# Patient Record
Sex: Male | Born: 1945 | Race: White | Hispanic: No | Marital: Married | Smoking: Former smoker
Health system: Southern US, Community
[De-identification: ages and names within clinical notes are randomized; demographics above are authoritative.]

## PROBLEM LIST (undated history)

## (undated) DIAGNOSIS — Z973 Presence of spectacles and contact lenses: Secondary | ICD-10-CM

## (undated) DIAGNOSIS — M1990 Unspecified osteoarthritis, unspecified site: Secondary | ICD-10-CM

## (undated) DIAGNOSIS — R351 Nocturia: Secondary | ICD-10-CM

## (undated) DIAGNOSIS — C61 Malignant neoplasm of prostate: Secondary | ICD-10-CM

## (undated) DIAGNOSIS — Z8619 Personal history of other infectious and parasitic diseases: Secondary | ICD-10-CM

## (undated) DIAGNOSIS — E7800 Pure hypercholesterolemia, unspecified: Secondary | ICD-10-CM

## (undated) DIAGNOSIS — I10 Essential (primary) hypertension: Secondary | ICD-10-CM

## (undated) HISTORY — PX: NO PAST SURGERIES: SHX2092

---

## 2015-11-28 ENCOUNTER — Ambulatory Visit
Admission: RE | Admit: 2015-11-28 | Discharge: 2015-11-28 | Disposition: A | Discharge status: Home / Self Care | Payer: 59 | Source: Ambulatory Visit | Attending: Radiation Oncology | Admitting: Radiation Oncology

## 2015-11-28 ENCOUNTER — Encounter: Payer: Self-pay | Admitting: Radiation Oncology

## 2015-11-28 VITALS — BP 139/76 | HR 98 | Resp 16 | Ht 73.0 in | Wt 194.6 lb

## 2015-11-28 DIAGNOSIS — I10 Essential (primary) hypertension: Secondary | ICD-10-CM | POA: Diagnosis not present

## 2015-11-28 DIAGNOSIS — E7800 Pure hypercholesterolemia, unspecified: Secondary | ICD-10-CM | POA: Insufficient documentation

## 2015-11-28 DIAGNOSIS — C61 Malignant neoplasm of prostate: Secondary | ICD-10-CM | POA: Insufficient documentation

## 2015-11-28 DIAGNOSIS — Z8619 Personal history of other infectious and parasitic diseases: Secondary | ICD-10-CM | POA: Diagnosis not present

## 2015-11-28 DIAGNOSIS — Z510 Encounter for antineoplastic radiation therapy: Secondary | ICD-10-CM | POA: Diagnosis present

## 2015-11-28 DIAGNOSIS — Z87891 Personal history of nicotine dependence: Secondary | ICD-10-CM | POA: Diagnosis not present

## 2015-11-28 HISTORY — DX: Pure hypercholesterolemia, unspecified: E78.00

## 2015-11-28 HISTORY — DX: Essential (primary) hypertension: I10

## 2015-11-28 HISTORY — DX: Personal history of other infectious and parasitic diseases: Z86.19

## 2015-11-28 HISTORY — DX: Malignant neoplasm of prostate: C61

## 2015-11-28 NOTE — Progress Notes (Signed)
See progress note under physician encounter. 

## 2015-11-28 NOTE — Progress Notes (Signed)
Radiation Oncology         [phone] ________________________________  Initial outpatient Consultation  Name: Miguel Larson MRN: [id number]  Date: 11/28/2015  DOB: [birth year]  CC:No primary care provider on file.  Carolan Clines, MD   REFERRING PHYSICIAN: Carolan Clines, MD  DIAGNOSIS: 70 y.o. gentleman with stage T1c adenocarcinoma of the prostate with a Gleason's score of 3+4 and a PSA of 4.22    ICD-9-CM ICD-10-CM   1. Malignant neoplasm of prostate ([place]) Miguel Larson is a 70 y.o. gentleman.  He was noted to have an elevated PSA of 4.3 by his primary care physician, Dr. Shelia Media.  Accordingly, he was referred for evaluation in urology by Dr. Gaynelle Arabian on 10/02/15.  The patient proceeded to transrectal ultrasound with 12 biopsies of the prostate on 11/13/15, digital rectal examination was performed at that time revealing no palpable mass.  The prostate volume measured 23.82 cc. Out of 12 core biopsies, 6 were positive.  The maximum Gleason score was 3+4, and this was seen in the left mid.  The patient reviewed the biopsy results with his urologist and he has kindly been referred today for discussion of potential radiation treatment options.    PREVIOUS RADIATION THERAPY: No  PAST MEDICAL HISTORY:  Past Medical History  Diagnosis Date  . Prostate cancer ([place])   . Hypercholesterolemia   . Hypertension   . History of hepatitis A virus infection      PAST SURGICAL HISTORY: Past Surgical History  Procedure Laterality Date  . Prostate biopsy      FAMILY HISTORY:  Family History  Problem Relation Age of Onset  . Cancer Father     colon  . Cancer Brother     skin  . Cancer Brother     blood disorder    SOCIAL HISTORY:  reports that he has quit smoking. His smoking use included Cigarettes. He has a 2 pack-year smoking history. He has never used smokeless tobacco. He reports that he drinks alcohol. He reports that he  does not use illicit drugs.  ALLERGIES: Review of patient's allergies indicates no known allergies.  MEDICATIONS:  Current Outpatient Prescriptions  Medication Sig Dispense Refill  . ALPRAZolam (XANAX) 0.25 MG tablet Take 0.25 mg by mouth at bedtime as needed for anxiety.    . benazepril (LOTENSIN) 20 MG tablet     . Multiple Vitamins-Minerals (MULTIVITAMIN GUMMIES ADULTS) CHEW Chew by mouth.    . sildenafil (VIAGRA) 100 MG tablet Take 100 mg by mouth daily as needed for erectile dysfunction.     No current facility-administered medications for this encounter.    REVIEW OF SYSTEMS: On review of systems, the patient reports that he is doing quite well overall. He states that he is pretty active, and does not any difficulty with shortness of breath or chest pain. He is not experiencing any fevers, chills, unintended weight changes, or night sweats. He denies any bowel disturbances. He does score in the mild category for erectile dysfunction and uses Viagra as needed. The patient completed an IPSS and IIEF questionnaire.  His IPSS score was 8 indicating moderate urinary outflow obstructive symptoms. He experiences nocturia, difficulty starting urinary stream and intermittent changes of being able to initiate stream, and a weak stream overall at times. Complete review of systems is obtained and is otherwise negative    PHYSICAL EXAM:   height is 6\' 1"  (1.854 m) and weight is 194 lb 9.6 oz (88.27  kg). His blood pressure is 139/76 and his pulse is 98. His respiration is 16 and oxygen saturation is 100%.   Pain scale 0/10 In general this is a well appearing Caucasian male in no acute distress. He is alert and oriented x4 and appropriate throughout the examination. HEENT reveals that the patient is normocephalic, atraumatic. EOMs are intact. PERRLA. Skin is intact without any evidence of gross lesions. Cardiovascular exam reveals a regular rate and rhythm, no clicks rubs or murmurs are auscultated. Chest  is clear to auscultation bilaterally. Lymphatic assessment is performed and does not reveal any adenopathy in the cervical, supraclavicular, axillary, or inguinal chains. Abdomen has active bowel sounds in all quadrants and is intact. The abdomen is soft, non tender, non distended. Lower extremities are negative for pretibial pitting edema, deep calf tenderness, cyanosis or clubbing.  KPS = 100  100 - Normal; no complaints; no evidence of disease. 90   - Able to carry on normal activity; minor signs or symptoms of disease. 80   - Normal activity with effort; some signs or symptoms of disease. 35   - Cares for self; unable to carry on normal activity or to do active work. 60   - Requires occasional assistance, but is able to care for most of his personal needs. 50   - Requires considerable assistance and frequent medical care. 50   - Disabled; requires special care and assistance. 21   - Severely disabled; hospital admission is indicated although death not imminent. 85   - Very sick; hospital admission necessary; active supportive treatment necessary. 10   - Moribund; fatal processes progressing rapidly. 0     - Dead  Karnofsky DA, Abelmann WH, Craver LS and Burchenal JH [phone]) The use of the nitrogen mustards in the palliative treatment of carcinoma: with particular reference to bronchogenic carcinoma Cancer 1 634-56   LABORATORY DATA:  No results found for: WBC, HGB, HCT, MCV, PLT No results found for: NA, K, CL, CO2 No results found for: ALT, AST, GGT, ALKPHOS, BILITOT   RADIOGRAPHY: No results found.    IMPRESSION: This gentleman is a pleasant 69 year-old with stage T1c adenocarcinoma of the prostate with a Gleason's score of 3+4 and a PSA of 4.22.  His T-Stage, Gleason's Score, and PSA put him into the intermediate risk group.  Accordingly he is eligible for a variety of potential treatment options including prostate seed implant, prostatectomy, and external beam radiation.  PLAN: Dr.  Tammi Klippel discusses the patient's biopsy findings, PSA, and symptoms. He reviews the patient's workup to date.  We discussed the natural history of prostate cancer.  We reviewed the the implications of T-stage, Gleason's Score, and PSA on decision-making and outcomes in prostate cancer.  We discussed radiation treatment in the management of prostate cancer with regard to the logistics and delivery of external beam radiation treatment as well as the logistics and delivery of prostate brachytherapy.  We compared and contrasted each of these approaches and also compared these against prostatectomy.  The patient expressed interest in prostate brachytherapy. Dr. Tammi Klippel filled out a patient counseling form for him with relevant treatment diagrams and we retained a copy for our records.   The patient is still deciding how he would like to proceed with treatment. The patient is scheduled to meet with Dr. Alinda Money on 12/10/15 to discuss surgical options.   We enjoyed meeting with him today, and will look forward to participating in the care of this very nice gentleman. I will  recheck to the patient following his visit with Dr. Alinda Money to see if he has any additional questions regarding the role of radiotherapy.   The above documentation reflects my direct findings during this shared patient visit. Please see the separate note by Dr. Tammi Klippel on this date for the remainder of the patient's plan of care.  Carola Rhine, PAC     This document serves as a record of services personally performed by Shona Simpson, PAC and Tyler Pita, MD. It was created on their behalf by Arlyce Harman, a trained medical scribe. The creation of this record is based on the scribe's personal observations and the provider's statements to them. This document has been checked and approved by the attending provider.

## 2015-11-28 NOTE — Progress Notes (Signed)
GU Location of Tumor / Histology: prostatic adenocarcinoma  If Prostate Cancer, Gleason Score is (3 + 4) and PSA is (4.22)  Miguel Larson was referred to Dr. Gaynelle Arabian from Dr. Shelia Media for further evaluation of an elevated PSA of 4.3 on 10/02/15.  Biopsies of prostate (if applicable) revealed:    Past/Anticipated interventions by urology, if any: biopsy and referral to Dr. Tammi Klippel  Past/Anticipated interventions by medical oncology, if any: no  Weight changes, if any: no  Bowel/Bladder complaints, if any: nocturia x 1, difficulty starting the urinary stream less than half the time, weak urinary stream intermittently, intermittent urinary stream,  and ED. Denies incontinence or leakage. Denies dysuria or hematuria.    Nausea/Vomiting, if any: no  Pain issues, if any:  no  SAFETY ISSUES:  Prior radiation? no  Pacemaker/ICD? no  Possible current pregnancy? no  Is the patient on methotrexate? no  Current Complaints / other details:  70 year old male. NKDA. Prostate volume 23.82 cc.

## 2016-01-07 ENCOUNTER — Other Ambulatory Visit: Payer: Self-pay | Admitting: Urology

## 2016-01-09 ENCOUNTER — Telehealth: Payer: Self-pay | Admitting: *Deleted

## 2016-01-09 NOTE — Telephone Encounter (Signed)
Called patient to inform of pre-seed appt. On 01-16-16, and his implant on 03-12-16, spoke with patient and he is aware of these appts.

## 2016-01-15 ENCOUNTER — Telehealth: Payer: Self-pay | Admitting: *Deleted

## 2016-01-15 NOTE — Telephone Encounter (Signed)
CALLED PATIENT TO REMIND OF APPTS. FOR 01-16-16, LVM FOR A RETURN CALL

## 2016-01-16 ENCOUNTER — Ambulatory Visit
Admission: RE | Admit: 2016-01-16 | Discharge: 2016-01-16 | Disposition: A | Discharge status: Home / Self Care | Payer: 59 | Source: Ambulatory Visit | Attending: Radiation Oncology | Admitting: Radiation Oncology

## 2016-01-16 DIAGNOSIS — C61 Malignant neoplasm of prostate: Secondary | ICD-10-CM

## 2016-01-16 DIAGNOSIS — Z510 Encounter for antineoplastic radiation therapy: Secondary | ICD-10-CM | POA: Diagnosis not present

## 2016-01-16 NOTE — Progress Notes (Signed)
Radiation Oncology         [phone] ________________________________  Name: Kacie Kristiansen MRN: [id number]  Date: 01/16/2016  DOB: Nov 14, 1945  Follow-Up Visit Note  CC: No primary care provider on file.  Carolan Clines, MD  Diagnosis:  70 y.o. mael with Stage T1c adenocarcinoma of the prostate with Gleason Score 3+4 and PSA of 4.22.     ICD-9-CM ICD-10-CM   1. Malignant neoplasm of prostate ([place]) [place]     Narrative:  The patient returns today for routine follow-up in anticipation of undergoing radioactive seed implant to the prostate. This is tentatively scheduled for 03/11/16 with Dr. Gaynelle Arabian. When he was seen on 11/28/15 he reported his IPSS score as an 8. His pubic arch study was performed today in the clinic and      adequate visualization of the entire prostate was noted, his prosthetic volume was 37 cc.                       On review of systems, the patient reports that he is doing well overall. He denies any chest pain, shortness of breath, cough, fevers, chills, night sweats, unintended weight changes. He denies any bowel or bladder disturbances, and denies abdominal pain, nausea or vomiting. He denies any new musculoskeletal or joint aches or pains. A complete review of systems is obtained and is otherwise negative.   Past Medical History:  Past Medical History  Diagnosis Date  . Prostate cancer ([place])   . Hypercholesterolemia   . Hypertension   . History of hepatitis A virus infection     Past Surgical History: Past Surgical History  Procedure Laterality Date  . Prostate biopsy      Social History:  Social History   Social History  . Marital Status: Married    Spouse Name: N/A  . Number of Children: N/A  . Years of Education: N/A   Occupational History  . Not on file.   Social History Main Topics  . Smoking status: Former Smoker -- 1.00 packs/day for 2 years    Types: Cigarettes  . Smokeless tobacco: Never Used  . Alcohol Use: Yes  . Drug  Use: No  . Sexual Activity: Yes   Other Topics Concern  . Not on file   Social History Narrative  . No narrative on file  The patient resides in [place].   Family History: Family History  Problem Relation Age of Onset  . Cancer Father     colon  . Cancer Brother     skin  . Cancer Brother     blood disorder    ALLERGIES:  has No Known Allergies.  Meds: Current Outpatient Prescriptions  Medication Sig Dispense Refill  . ALPRAZolam (XANAX) 0.25 MG tablet Take 0.25 mg by mouth at bedtime as needed for anxiety.    . benazepril (LOTENSIN) 20 MG tablet     . Multiple Vitamins-Minerals (MULTIVITAMIN GUMMIES ADULTS) CHEW Chew by mouth.    . sildenafil (VIAGRA) 100 MG tablet Take 100 mg by mouth daily as needed for erectile dysfunction.     No current facility-administered medications for this encounter.    Physical Findings:  vitals were not taken for this visit.  Pain scale 0/10 In general this is a well appearing Caucasian male in no acute distress. He's alert and oriented x4 and appropriate throughout the examination. Cardiopulmonary assessment is negative for acute distress and he exhibits normal effort.    Lab Findings: No results  found for: WBC, HGB, HCT, PLT  No results found for: NA, K, CHLORIDE, CO2, GLUCOSE, BUN, CREATININE, BILITOT, ALKPHOS, AST, ALT, PROT, ALBUMIN, CALCIUM, ANIONGAP  Radiographic Findings: No results found.  Impression/Plan: 1. T1c adenocarcinoma of the prostate with a Gleason's score of 3+4 and a PSA of 4.22. Dr. Johny Shears evaluated the patient is well, and benefits, perioperative expectations, along the short-term effects of radioactive seed implant. We did discuss precautions regarding radioactivity in exposure to pregnant women and young children. We will plan to see the patient 3 weeks following his radioactive seed implant, and he is encouraged to call if he has any questions or concerns prior to the actual procedure. He is advised to  discuss his interest in hospitalization for the procedure with Dr. Gaynelle Arabian. At the end of the conversation all questions were answered to his satisfaction, and he is ready to move forward.    The above documentation reflects my direct findings during this shared patient visit. Please see the separate simulation documentation and note by Dr. Tammi Klippel on this date for the remainder of the patient's plan of care.    Carola Rhine, PAC

## 2016-01-16 NOTE — Progress Notes (Signed)
  Radiation Oncology         [phone] ________________________________  Name: Kass Weisheit MRN: [id number]  Date: 01/16/2016  DOB: 12/22/1945  SIMULATION AND TREATMENT PLANNING NOTE PUBIC ARCH STUDY  CC:No primary care provider on file.  Carolan Clines, MD  DIAGNOSIS: 70 y.o. gentleman with stage T1c adenocarcinoma of the prostate with a Gleason's score of 3+4 and a PSA of 4.22     ICD-9-CM ICD-10-CM   1. Malignant neoplasm of prostate ([place]) [place]:  The patient presented today for evaluation for possible prostate seed implant. He was brought to the radiation planning suite and placed supine on the CT couch. A 3-dimensional image study set was obtained in upload to the planning computer. There, on each axial slice, I contoured the prostate gland. Then, using three-dimensional radiation planning tools I reconstructed the prostate in view of the structures from the transperineal needle pathway to assess for possible pubic arch interference. In doing so, I did not appreciate any pubic arch interference. Also, the patient's prostate volume was estimated based on the drawn structure. The volume was 37 cc.  Given the pubic arch appearance and prostate volume, patient remains a good candidate to proceed with prostate seed implant. Today, he freely provided informed written consent to proceed.    PLAN: The patient will undergo prostate seed implant.   ________________________________  Sheral Apley. Tammi Klippel, M.D.    This document serves as a record of services personally performed by Tyler Pita, MD. It was created on his behalf by Lendon Collar, a trained medical scribe. The creation of this record is based on the scribe's personal observations and the provider's statements to them. This document has been checked and approved by the attending provider.

## 2016-01-17 ENCOUNTER — Ambulatory Visit (HOSPITAL_COMMUNITY)
Admission: RE | Admit: 2016-01-17 | Discharge: 2016-01-17 | Disposition: A | Discharge status: Home / Self Care | Payer: 59 | Source: Ambulatory Visit | Attending: Urology | Admitting: Urology

## 2016-01-17 DIAGNOSIS — C61 Malignant neoplasm of prostate: Secondary | ICD-10-CM | POA: Insufficient documentation

## 2016-01-20 ENCOUNTER — Telehealth: Payer: Self-pay | Admitting: *Deleted

## 2016-01-20 NOTE — Telephone Encounter (Signed)
CALLED PATIENT TO ASK QUESTION, LVM FOR A RETURN CALL 

## 2016-01-31 ENCOUNTER — Encounter: Payer: Self-pay | Admitting: Radiation Oncology

## 2016-03-04 ENCOUNTER — Telehealth: Payer: Self-pay | Admitting: *Deleted

## 2016-03-04 NOTE — Telephone Encounter (Signed)
CALLED PATIENT TO REMIND OF LABS FOR 03-05-16 FOR PROCEDURE ON 03-12-16, SPOKE WITH PATIENT'S WIFE - GINA AND SHE IS AWARE OF THIS APPT.

## 2016-03-04 NOTE — Telephone Encounter (Signed)
XXXX 

## 2016-03-05 ENCOUNTER — Encounter (HOSPITAL_BASED_OUTPATIENT_CLINIC_OR_DEPARTMENT_OTHER): Payer: Self-pay | Admitting: *Deleted

## 2016-03-05 DIAGNOSIS — Z79899 Other long term (current) drug therapy: Secondary | ICD-10-CM | POA: Diagnosis not present

## 2016-03-05 DIAGNOSIS — I10 Essential (primary) hypertension: Secondary | ICD-10-CM | POA: Diagnosis not present

## 2016-03-05 DIAGNOSIS — N529 Male erectile dysfunction, unspecified: Secondary | ICD-10-CM | POA: Diagnosis not present

## 2016-03-05 DIAGNOSIS — Z87891 Personal history of nicotine dependence: Secondary | ICD-10-CM | POA: Diagnosis not present

## 2016-03-05 DIAGNOSIS — C61 Malignant neoplasm of prostate: Secondary | ICD-10-CM | POA: Diagnosis not present

## 2016-03-05 LAB — COMPREHENSIVE METABOLIC PANEL
ALBUMIN: 4.5 g/dL (ref 3.5–5.0)
ALK PHOS: 55 U/L (ref 38–126)
ALT: 23 U/L (ref 17–63)
ANION GAP: 5 (ref 5–15)
AST: 23 U/L (ref 15–41)
BILIRUBIN TOTAL: 0.7 mg/dL (ref 0.3–1.2)
BUN: 24 mg/dL — AB (ref 6–20)
CALCIUM: 9.4 mg/dL (ref 8.9–10.3)
CO2: 27 mmol/L (ref 22–32)
Chloride: 105 mmol/L (ref 101–111)
Creatinine, Ser: 1.24 mg/dL (ref 0.61–1.24)
GFR calc Af Amer: >60 mL/min (ref >60)
GFR calc non Af Amer: 57 mL/min — ABNORMAL LOW (ref >60)
GLUCOSE: 74 mg/dL (ref 65–99)
Potassium: 4.5 mmol/L (ref 3.5–5.1)
Sodium: 137 mmol/L (ref 135–145)
TOTAL PROTEIN: 7.3 g/dL (ref 6.5–8.1)

## 2016-03-05 LAB — PROTIME-INR
INR: 1.1 (ref 0.00–1.49)
Prothrombin Time: 13.9 seconds (ref 11.6–15.2)

## 2016-03-05 LAB — CBC
HEMATOCRIT: 40.1 % (ref 39.0–52.0)
HEMOGLOBIN: 13.5 g/dL (ref 13.0–17.0)
MCH: 31.8 pg (ref 26.0–34.0)
MCHC: 33.7 g/dL (ref 30.0–36.0)
MCV: 94.4 fL (ref 78.0–100.0)
Platelets: 289 10*3/uL (ref 150–400)
RBC: 4.25 MIL/uL (ref 4.22–5.81)
RDW: 13.5 % (ref 11.5–15.5)
WBC: 5 10*3/uL (ref 4.0–10.5)

## 2016-03-05 LAB — APTT: APTT: 28 s (ref 24–37)

## 2016-03-05 NOTE — Progress Notes (Signed)
NPO AFTER MN.  ARRIVE AT 0700.  CURRENT LAB RESULTS, CXR, AND EKG IN CHART AND EPIC.  WILL DO FLEET ENEMA AM DOS .

## 2016-03-09 ENCOUNTER — Ambulatory Visit: Admission: RE | Admit: 2016-03-09 | Payer: 59 | Source: Ambulatory Visit

## 2016-03-11 ENCOUNTER — Telehealth: Payer: Self-pay | Admitting: *Deleted

## 2016-03-11 NOTE — Anesthesia Preprocedure Evaluation (Signed)
Anesthesia Evaluation  Patient identified by MRN, date of birth, ID band Patient awake    Reviewed: Allergy & Precautions, H&P , NPO status , Patient's Chart, lab work & pertinent test results  History of Anesthesia Complications Negative for: history of anesthetic complications  Airway Mallampati: II  TM Distance: >3 FB Neck ROM: full    Dental no notable dental hx.    Pulmonary former smoker,    Pulmonary exam normal breath sounds clear to auscultation       Cardiovascular hypertension, On Medications Normal cardiovascular exam Rhythm:regular Rate:Normal     Neuro/Psych negative neurological ROS     GI/Hepatic negative GI ROS, Neg liver ROS,   Endo/Other  negative endocrine ROS  Renal/GU negative Renal ROS     Musculoskeletal  (+) Arthritis ,   Abdominal   Peds  Hematology negative hematology ROS (+)   Anesthesia Other Findings   Reproductive/Obstetrics negative OB ROS                             Anesthesia Physical Anesthesia Plan  ASA: II  Anesthesia Plan: General   Post-op Pain Management:    Induction: Intravenous  Airway Management Planned: Oral ETT and LMA  Additional Equipment:   Intra-op Plan:   Post-operative Plan: Extubation in OR  Informed Consent: I have reviewed the patients History and Physical, chart, labs and discussed the procedure including the risks, benefits and alternatives for the proposed anesthesia with the patient or authorized representative who has indicated his/her understanding and acceptance.   Dental Advisory Given  Plan Discussed with: Anesthesiologist, CRNA and Surgeon  Anesthesia Plan Comments: (LMA vs ETT)        Anesthesia Quick Evaluation

## 2016-03-11 NOTE — Telephone Encounter (Signed)
CALLED PATIENT TO REMIND OF IMPLANT FOR 03-12-16, SPOKE WITH PATIENT'S WIFE- GINA AND SHE IS AWARE OF THIS PROCEDURE

## 2016-03-12 ENCOUNTER — Ambulatory Visit (HOSPITAL_COMMUNITY): Payer: Managed Care, Other (non HMO)

## 2016-03-12 ENCOUNTER — Ambulatory Visit (HOSPITAL_BASED_OUTPATIENT_CLINIC_OR_DEPARTMENT_OTHER)
Admission: RE | Admit: 2016-03-12 | Discharge: 2016-03-12 | Disposition: A | Discharge status: Home / Self Care | Payer: Managed Care, Other (non HMO) | Source: Ambulatory Visit | Attending: Urology | Admitting: Urology

## 2016-03-12 ENCOUNTER — Ambulatory Visit (HOSPITAL_BASED_OUTPATIENT_CLINIC_OR_DEPARTMENT_OTHER): Payer: Managed Care, Other (non HMO) | Admitting: Anesthesiology

## 2016-03-12 ENCOUNTER — Encounter (HOSPITAL_BASED_OUTPATIENT_CLINIC_OR_DEPARTMENT_OTHER)
Admission: RE | Disposition: A | Discharge status: Home / Self Care | Payer: Self-pay | Source: Ambulatory Visit | Attending: Urology

## 2016-03-12 ENCOUNTER — Encounter (HOSPITAL_BASED_OUTPATIENT_CLINIC_OR_DEPARTMENT_OTHER): Payer: Self-pay | Admitting: *Deleted

## 2016-03-12 DIAGNOSIS — C61 Malignant neoplasm of prostate: Secondary | ICD-10-CM | POA: Diagnosis not present

## 2016-03-12 DIAGNOSIS — N529 Male erectile dysfunction, unspecified: Secondary | ICD-10-CM | POA: Insufficient documentation

## 2016-03-12 DIAGNOSIS — Z87891 Personal history of nicotine dependence: Secondary | ICD-10-CM | POA: Insufficient documentation

## 2016-03-12 DIAGNOSIS — Z79899 Other long term (current) drug therapy: Secondary | ICD-10-CM | POA: Insufficient documentation

## 2016-03-12 DIAGNOSIS — I10 Essential (primary) hypertension: Secondary | ICD-10-CM | POA: Insufficient documentation

## 2016-03-12 HISTORY — DX: Nocturia: R35.1

## 2016-03-12 HISTORY — PX: RADIOACTIVE SEED IMPLANT: SHX5150

## 2016-03-12 HISTORY — DX: Presence of spectacles and contact lenses: Z97.3

## 2016-03-12 HISTORY — DX: Unspecified osteoarthritis, unspecified site: M19.90

## 2016-03-12 SURGERY — INSERTION, RADIATION SOURCE, PROSTATE
Anesthesia: General | Site: Prostate

## 2016-03-12 MED ORDER — HYOSCYAMINE SULFATE 0.125 MG SL SUBL
SUBLINGUAL_TABLET | SUBLINGUAL | Status: AC
  Filled 2016-03-12: qty 1

## 2016-03-12 MED ORDER — STERILE WATER FOR IRRIGATION IR SOLN
Status: DC | PRN
  Administered 2016-03-12: 3 mL

## 2016-03-12 MED ORDER — KETOROLAC TROMETHAMINE 30 MG/ML IJ SOLN
INTRAMUSCULAR | Status: AC
  Filled 2016-03-12: qty 1

## 2016-03-12 MED ORDER — TAMSULOSIN HCL 0.4 MG PO CAPS: 0.4000 mg | ORAL_CAPSULE | Freq: Every day | ORAL | Status: DC

## 2016-03-12 MED ORDER — ONDANSETRON HCL 4 MG/2ML IJ SOLN
INTRAMUSCULAR | Status: DC | PRN
  Administered 2016-03-12: 4 mg via INTRAVENOUS

## 2016-03-12 MED ORDER — FENTANYL CITRATE (PF) 100 MCG/2ML IJ SOLN
INTRAMUSCULAR | Status: DC | PRN
  Administered 2016-03-12: 25 ug via INTRAVENOUS
  Administered 2016-03-12: 50 ug via INTRAVENOUS
  Administered 2016-03-12: 25 ug via INTRAVENOUS
  Administered 2016-03-12 (×2): 50 ug via INTRAVENOUS

## 2016-03-12 MED ORDER — APAP-CAFF-DIHYDROCODEINE 325-30-16 MG PO TABS: 325.0000 mg | ORAL_TABLET | Freq: Four times a day (QID) | ORAL | Status: DC | PRN

## 2016-03-12 MED ORDER — PHENAZOPYRIDINE HCL 200 MG PO TABS: 200.0000 mg | ORAL_TABLET | Freq: Three times a day (TID) | ORAL | Status: DC | PRN

## 2016-03-12 MED ORDER — SODIUM CHLORIDE 0.9 % IR SOLN
Status: DC | PRN
  Administered 2016-03-12: 1000 mL via INTRAVESICAL

## 2016-03-12 MED ORDER — PROPOFOL 10 MG/ML IV BOLUS
INTRAVENOUS | Status: DC | PRN
  Administered 2016-03-12: 160 mg via INTRAVENOUS

## 2016-03-12 MED ORDER — PROMETHAZINE HCL 25 MG/ML IJ SOLN
6.2500 mg | INTRAMUSCULAR | Status: DC | PRN
  Filled 2016-03-12: qty 1

## 2016-03-12 MED ORDER — TAMSULOSIN HCL 0.4 MG PO CAPS
ORAL_CAPSULE | ORAL | Status: AC
  Filled 2016-03-12: qty 1

## 2016-03-12 MED ORDER — TRIMETHOPRIM 100 MG PO TABS
100.0000 mg | ORAL_TABLET | ORAL | Status: DC
Start: 2016-03-12 — End: 2016-10-21

## 2016-03-12 MED ORDER — CIPROFLOXACIN IN D5W 400 MG/200ML IV SOLN
INTRAVENOUS | Status: AC
  Filled 2016-03-12: qty 200

## 2016-03-12 MED ORDER — FENTANYL CITRATE (PF) 100 MCG/2ML IJ SOLN
INTRAMUSCULAR | Status: AC
  Filled 2016-03-12: qty 2

## 2016-03-12 MED ORDER — ACETAMINOPHEN 500 MG PO TABS
1000.0000 mg | ORAL_TABLET | Freq: Four times a day (QID) | ORAL | Status: DC | PRN
Start: 2016-03-12 — End: 2016-03-12
  Administered 2016-03-12: 1000 mg via ORAL
  Filled 2016-03-12: qty 2

## 2016-03-12 MED ORDER — LACTATED RINGERS IV SOLN
INTRAVENOUS | Status: DC
  Administered 2016-03-12 (×2): via INTRAVENOUS
  Filled 2016-03-12: qty 1000

## 2016-03-12 MED ORDER — ACETAMINOPHEN 500 MG PO TABS
ORAL_TABLET | ORAL | Status: AC
  Filled 2016-03-12: qty 2

## 2016-03-12 MED ORDER — BELLADONNA ALKALOIDS-OPIUM 16.2-60 MG RE SUPP
RECTAL | Status: DC | PRN
  Administered 2016-03-12: 1 via RECTAL

## 2016-03-12 MED ORDER — DIATRIZOATE MEGLUMINE 30 % UR SOLN
URETHRAL | Status: DC | PRN
  Administered 2016-03-12: 7 mL via URETHRAL

## 2016-03-12 MED ORDER — PHENAZOPYRIDINE HCL 100 MG PO TABS
ORAL_TABLET | ORAL | Status: AC
  Filled 2016-03-12: qty 2

## 2016-03-12 MED ORDER — FLEET ENEMA 7-19 GM/118ML RE ENEM
1.0000 | ENEMA | Freq: Once | RECTAL | Status: DC
  Filled 2016-03-12: qty 1

## 2016-03-12 MED ORDER — ONDANSETRON HCL 4 MG/2ML IJ SOLN
INTRAMUSCULAR | Status: AC
  Filled 2016-03-12: qty 2

## 2016-03-12 MED ORDER — PROPOFOL 10 MG/ML IV BOLUS
INTRAVENOUS | Status: AC
  Filled 2016-03-12: qty 20

## 2016-03-12 MED ORDER — DEXAMETHASONE SODIUM PHOSPHATE 10 MG/ML IJ SOLN
INTRAMUSCULAR | Status: AC
  Filled 2016-03-12: qty 1

## 2016-03-12 MED ORDER — BELLADONNA ALKALOIDS-OPIUM 16.2-60 MG RE SUPP
RECTAL | Status: AC
  Filled 2016-03-12: qty 1

## 2016-03-12 MED ORDER — KETOROLAC TROMETHAMINE 30 MG/ML IJ SOLN
30.0000 mg | Freq: Once | INTRAMUSCULAR | Status: DC
  Filled 2016-03-12: qty 1

## 2016-03-12 MED ORDER — HYOSCYAMINE SULFATE 0.125 MG SL SUBL: 0.1250 mg | SUBLINGUAL_TABLET | SUBLINGUAL | Status: DC | PRN

## 2016-03-12 MED ORDER — TAMSULOSIN HCL 0.4 MG PO CAPS
0.4000 mg | ORAL_CAPSULE | Freq: Every day | ORAL | Status: DC
Start: 2016-03-12 — End: 2016-03-12
  Administered 2016-03-12: 0.4 mg via ORAL
  Filled 2016-03-12: qty 1

## 2016-03-12 MED ORDER — DEXAMETHASONE SODIUM PHOSPHATE 4 MG/ML IJ SOLN
INTRAMUSCULAR | Status: DC | PRN
  Administered 2016-03-12: 10 mg via INTRAVENOUS

## 2016-03-12 MED ORDER — KETOROLAC TROMETHAMINE 30 MG/ML IJ SOLN
INTRAMUSCULAR | Status: DC | PRN
  Administered 2016-03-12: 15 mg via INTRAVENOUS

## 2016-03-12 MED ORDER — STERILE WATER FOR IRRIGATION IR SOLN
Status: DC | PRN
  Administered 2016-03-12: 3000 mL

## 2016-03-12 MED ORDER — FENTANYL CITRATE (PF) 100 MCG/2ML IJ SOLN
25.0000 ug | INTRAMUSCULAR | Status: DC | PRN
  Filled 2016-03-12: qty 1

## 2016-03-12 MED ORDER — HYOSCYAMINE SULFATE 0.125 MG SL SUBL
0.1250 mg | SUBLINGUAL_TABLET | SUBLINGUAL | Status: DC | PRN
  Administered 2016-03-12: 0.125 mg via SUBLINGUAL
  Filled 2016-03-12: qty 1

## 2016-03-12 MED ORDER — CIPROFLOXACIN IN D5W 400 MG/200ML IV SOLN
400.0000 mg | INTRAVENOUS | Status: AC
  Administered 2016-03-12: 400 mg via INTRAVENOUS
  Filled 2016-03-12: qty 200

## 2016-03-12 MED ORDER — LIDOCAINE HCL (CARDIAC) 20 MG/ML IV SOLN
INTRAVENOUS | Status: DC | PRN
  Administered 2016-03-12: 80 mg via INTRAVENOUS

## 2016-03-12 MED ORDER — LIDOCAINE HCL (CARDIAC) 20 MG/ML IV SOLN
INTRAVENOUS | Status: AC
  Filled 2016-03-12: qty 5

## 2016-03-12 MED ORDER — PHENAZOPYRIDINE HCL 200 MG PO TABS
200.0000 mg | ORAL_TABLET | Freq: Once | ORAL | Status: AC
  Administered 2016-03-12: 200 mg via ORAL
  Filled 2016-03-12: qty 1

## 2016-03-12 SURGICAL SUPPLY — 28 items
BAG URINE DRAINAGE (UROLOGICAL SUPPLIES) ×2 IMPLANT
BLADE CLIPPER SURG (BLADE) ×2 IMPLANT
CATH FOLEY 2WAY SLVR  5CC 16FR (CATHETERS) ×2
CATH FOLEY 2WAY SLVR 5CC 16FR (CATHETERS) ×2 IMPLANT
CATH ROBINSON RED A/P 20FR (CATHETERS) ×2 IMPLANT
CLOTH BEACON ORANGE TIMEOUT ST (SAFETY) ×2 IMPLANT
COVER BACK TABLE 60X90IN (DRAPES) ×2 IMPLANT
COVER MAYO STAND STRL (DRAPES) ×2 IMPLANT
DRSG TEGADERM 4X4.75 (GAUZE/BANDAGES/DRESSINGS) ×2 IMPLANT
DRSG TEGADERM 8X12 (GAUZE/BANDAGES/DRESSINGS) ×2 IMPLANT
GLOVE BIO SURGEON STRL SZ7.5 (GLOVE) ×4 IMPLANT
GLOVE ECLIPSE 8.0 STRL XLNG CF (GLOVE) ×8 IMPLANT
GOWN STRL REUS W/ TWL LRG LVL3 (GOWN DISPOSABLE) IMPLANT
GOWN STRL REUS W/ TWL XL LVL3 (GOWN DISPOSABLE) ×1 IMPLANT
GOWN STRL REUS W/TWL LRG LVL3 (GOWN DISPOSABLE) ×2 IMPLANT
GOWN STRL REUS W/TWL XL LVL3 (GOWN DISPOSABLE) ×1
HOLDER FOLEY CATH W/STRAP (MISCELLANEOUS) ×2 IMPLANT
IV NS 1000ML (IV SOLUTION) ×2
IV NS 1000ML BAXH (IV SOLUTION) ×2 IMPLANT
KIT ROOM TURNOVER WOR (KITS) ×2 IMPLANT
PACK BASIN DAY SURGERY FS (CUSTOM PROCEDURE TRAY) ×2 IMPLANT
PACK CYSTO (CUSTOM PROCEDURE TRAY) ×2 IMPLANT
SPONGE GAUZE 4X4 12PLY STER LF (GAUZE/BANDAGES/DRESSINGS) ×2 IMPLANT
SYRINGE 10CC LL (SYRINGE) ×2 IMPLANT
UNDERPAD 30X30 INCONTINENT (UNDERPADS AND DIAPERS) ×4 IMPLANT
WATER STERILE IRR 3000ML UROMA (IV SOLUTION) ×2 IMPLANT
WATER STERILE IRR 500ML POUR (IV SOLUTION) ×2 IMPLANT
radioactive seed ×2 IMPLANT

## 2016-03-12 NOTE — Progress Notes (Signed)
  Radiation Oncology         [phone] ________________________________  Name: Rodriguez Sedore MRN: [id number]  Date: 03/12/2016  DOB: 1946/03/20       Prostate Seed Implant  [id number] DAVIDSON, MD  No ref. provider found  DIAGNOSIS: 70 y.o. gentleman with stage T1c adenocarcinoma of the prostate with a Gleason's score of 3+4 and a PSA of 4.22    ICD-9-CM ICD-10-CM   1. Prostate cancer ([place]) 185 C61 DG Chest 2 View     DG Chest 2 View     Discontinue IV     Discharge patient     Continue foley catheter     Ice pack    PROCEDURE: Insertion of radioactive I-125 seeds into the prostate gland.  RADIATION DOSE: 145 Gy, definitive therapy.  TECHNIQUE: Cedarius Teague was brought to the operating room with the urologist. He was placed in the dorsolithotomy position. He was catheterized and a rectal tube was inserted. The perineum was shaved, prepped and draped. The ultrasound probe was then introduced into the rectum to see the prostate gland.  TREATMENT DEVICE: A needle grid was attached to the ultrasound probe stand and anchor needles were placed.  3D PLANNING: The prostate was imaged in 3D using a sagittal sweep of the prostate probe. These images were transferred to the planning computer. There, the prostate, urethra and rectum were defined on each axial reconstructed image. Then, the software created an optimized 3D plan and a few seed positions were adjusted. The quality of the plan was reviewed using [facility] information for the target and the following two organs at risk:  Urethra and Rectum.  Then the accepted plan was uploaded to the seed Selectron afterloading unit.  PROSTATE VOLUME STUDY:  Using transrectal ultrasound the volume of the prostate was verified to be 43.415 cc.  SPECIAL TREATMENT PROCEDURE/SUPERVISION AND HANDLING: The Nucletron FIRST system was used to place the needles under sagittal guidance. A total of 22 needles were used to deposit 73 seeds in the prostate  gland. The individual seed activity was 0.428 mCi.  COMPLEX SIMULATION: At the end of the procedure, an anterior radiograph of the pelvis was obtained to document seed positioning and count. Cystoscopy was performed to check the urethra and bladder.  MICRODOSIMETRY: At the end of the procedure, the patient was emitting 0.155 mR/hr at 1 meter. Accordingly, he was considered safe for hospital discharge.  PLAN: The patient will return to the radiation oncology clinic for post implant CT dosimetry in three weeks.   ________________________________  Sheral Apley Tammi Klippel, M.D.

## 2016-03-12 NOTE — Anesthesia Procedure Notes (Signed)
Procedure Name: LMA Insertion Date/Time: 03/12/2016 8:50 AM Performed by: Bethena Roys T Pre-anesthesia Checklist: Patient identified, Emergency Drugs available, Suction available and Patient being monitored Patient Re-evaluated:Patient Re-evaluated prior to inductionOxygen Delivery Method: Circle system utilized Preoxygenation: Pre-oxygenation with 100% oxygen Intubation Type: IV induction Ventilation: Mask ventilation without difficulty LMA: LMA inserted LMA Size: 5.0 Number of attempts: 1 Airway Equipment and Method: Bite block Placement Confirmation: positive ETCO2 Tube secured with: Tape Dental Injury: Teeth and Oropharynx as per pre-operative assessment

## 2016-03-12 NOTE — Anesthesia Postprocedure Evaluation (Signed)
Anesthesia Post Note  Patient: Miguel Larson  Procedure(s) Performed: Procedure(s) (LRB): RADIOACTIVE SEED IMPLANT/BRACHYTHERAPY IMPLANT (N/A)  Patient location during evaluation: PACU Anesthesia Type: General Level of consciousness: awake and alert Pain management: pain level controlled Vital Signs Assessment: post-procedure vital signs reviewed and stable Respiratory status: spontaneous breathing, nonlabored ventilation, respiratory function stable and patient connected to nasal cannula oxygen Cardiovascular status: blood pressure returned to baseline and stable Postop Assessment: no signs of nausea or vomiting Anesthetic complications: no    Last Vitals:  Filed Vitals:   03/12/16 1130 03/12/16 1145  BP: 131/88 133/76  Pulse: 64 63  Temp:  36.4 C  Resp: 12 11    Last Pain: There were no vitals filed for this visit.               Zenaida Deed

## 2016-03-12 NOTE — Interval H&P Note (Signed)
History and Physical Interval Note:  03/12/2016 8:31 AM  Miguel Larson  has presented today for surgery, with the diagnosis of PROSTATE CANCER  The various methods of treatment have been discussed with the patient and family. After consideration of risks, benefits and other options for treatment, the patient has consented to  Procedure(s): RADIOACTIVE SEED IMPLANT/BRACHYTHERAPY IMPLANT (N/A) as a surgical intervention .  The patient's history has been reviewed, patient examined, no change in status, stable for surgery.  I have reviewed the patient's chart and labs.  Questions were answered to the patient's satisfaction.     Miguel Larson

## 2016-03-12 NOTE — Discharge Instructions (Addendum)
°Brachytherapy for Prostate Cancer, Care After °Refer to this sheet in the next few weeks. These instructions provide you with information on caring for yourself after your procedure. Your health care provider may also give you more specific instructions. Your treatment has been planned according to current medical practices, but problems sometimes occur. Call your health care provider if you have any problems or questions after your procedure. °WHAT TO EXPECT AFTER THE PROCEDURE °The area behind the scrotum will probably be tender and bruised. For a short period of time you may have: °· Difficulty passing urine. You may need a catheter for a few days to a month. °· Blood in the urine or semen. °· A feeling of constipation because of prostate swelling. °· Frequent feeling of an urgent need to urinate. °For a long period of time you may have: °· Inflammation of the rectum. This happens in about 2% of people who have the procedure. °· Erection problems. These vary with age and occur in about 15-40% of men. °· Difficulty urinating. This is caused by scarring in the urethra. °· Diarrhea. °HOME CARE INSTRUCTIONS  °· Take medicines only as directed by your health care provider. °· You will probably have a catheter in your bladder for several days. You will have blood in the urine bag and should drink a lot of fluids to keep it a light red color. °· Keep all follow-up visits as directed by your health care provider. If you have a catheter, it will be removed during one of these visits. °· Try not to sit directly on the area behind the scrotum. A soft cushion can decrease the discomfort. Ice packs may also be helpful for the discomfort. Do not put ice directly on the skin. °· Shower and wash the area behind the scrotum gently. Do not sit in a tub. °· If you have had the brachytherapy that uses the seeds, limit your close contact with children and pregnant women for 2 months because of the radiation still in the prostate.  After that period of time, the levels drop off quickly. °SEEK IMMEDIATE MEDICAL CARE IF:  °· You have a fever. °· You have chills. °· You have shortness of breath. °· You have chest pain. °· You have thick blood, like tomato juice, in the urine bag. °· Your catheter is blocked so urine cannot get into the bag. Your bladder area or lower abdomen may be swollen. °· There is excessive bleeding from your rectum. It is normal to have a little blood mixed with your stool. °· There is severe discomfort in the treated area that does not go away with pain medicine. °· You have abdominal discomfort. °· You have severe nausea or vomiting. °· You develop any new or unusual symptoms. °  °This information is not intended to replace advice given to you by your health care provider. Make sure you discuss any questions you have with your health care provider. °  °Document Released: 10/10/2010 Document Revised: 09/28/2014 Document Reviewed: 02/28/2013 °Elsevier Interactive Patient Education ©2016 [employer] ° ° °Post Anesthesia Home Care Instructions ° °Activity: °Get plenty of rest for the remainder of the day. A responsible adult should stay with you for 24 hours following the procedure.  °For the next 24 hours, DO NOT: °-Drive a car °-Operate machinery °-Drink alcoholic beverages °-Take any medication unless instructed by your physician °-Make any legal decisions or sign important papers. ° °Meals: °Start with liquid foods such as gelatin or soup. Progress to regular   foods as tolerated. Avoid greasy, spicy, heavy foods. If nausea and/or vomiting occur, drink only clear liquids until the nausea and/or vomiting subsides. Call your physician if vomiting continues. ° °Special Instructions/Symptoms: °Your throat may feel dry or sore from the anesthesia or the breathing tube placed in your throat during surgery. If this causes discomfort, gargle with warm salt water. The discomfort should disappear within 24 hours. ° °If you had a  scopolamine patch placed behind your ear for the management of post- operative nausea and/or vomiting: ° °1. The medication in the patch is effective for 72 hours, after which it should be removed.  Wrap patch in a tissue and discard in the trash. Wash hands thoroughly with soap and water. °2. You may remove the patch earlier than 72 hours if you experience unpleasant side effects which may include dry mouth, dizziness or visual disturbances. °3. Avoid touching the patch. Wash your hands with soap and water after contact with the patch. °  ° °

## 2016-03-12 NOTE — Transfer of Care (Signed)
Immediate Anesthesia Transfer of Care Note  Patient: Miguel Larson  Procedure(s) Performed: Procedure(s): RADIOACTIVE SEED IMPLANT/BRACHYTHERAPY IMPLANT (N/A)  Patient Location: PACU  Anesthesia Type:General  Level of Consciousness: awake, alert  and oriented  Airway & Oxygen Therapy: Patient Spontanous Breathing and Patient connected to nasal cannula oxygen  Post-op Asses, ent: Report given to RN  Post vital signs: Reviewed and stable  Last Vitals: 129/92, 73, 10, 100%, 97.8 Filed Vitals:   03/12/16 0714  BP: 137/82  Pulse: 82  Temp: 36.4 C  Resp: 16    Last Pain: There were no vitals filed for this visit.    Patients Stated Pain Goal: 6 ([id number])  Complications: No apparent anesthesia complications

## 2016-03-12 NOTE — H&P (Signed)
Reason For Visit F/u consult after seeing Dr. Alinda Money & Dr. Tammi Klippel  Active Problems Problems  1. Prostate cancer (C61) History of Present Illness 70 yo male returns today for a f/u consult after seeing Dr. Alinda Money & Dr. Tammi Klippel for hx of prostate cancer. He is leaning towards seed implant.  Hx of an elevated PSA of 4.22 prompting a TRUS biopsy of the prostate on 11/13/15 that confirmed the presence of Gleason 3+4=7 adenocarcinoma of the prostate with 6 out of 12 biopsy cores positive for malignancy. He has no family history of prostate cancer. TNM stage: cT1c Nx Mx PSA: 4.22 Gleason score: 3+4=7 Biopsy (11/13/15): 6/12 cores positive Left: L lateral mid (60%, 3+3=6), L mid (20%, 3+4=7, PNI) Right: R lateral apex (20%, 3+3=6), R mid (20%, 3+3=6), R lateral mid (20%, 3+3=6), R lateral base (20%, 3+3=6) Prostate volume: 23.8 cc Nomogram  OC disease: 41% EPE: 58% SVI: 5% LNI: 4% PFS (surgery): 89% at 5 years, 80% at 10 years Urinary function: He has mild lower urinary tract symptoms. IPSS is 9. Erectile function: He does have erectile dysfunction. SHIM score is 9. However, he can obtain erections with Viagra more often than not.  Past Medical History Problems  1. History of hepatitis A virus infection (Z86.19) 2. History of hypercholesterolemia (Z86.39) 3. History of hypertension (Z86.79) Surgical History Problems  1. History of No Surgical Problems Current Meds 1. Benazepril HCl - 20 MG Oral Tablet; Therapy: (Recorded:31Jan2017) to Recorded Allergies Medication  1. No Known Drug Allergies Family History Problems  1. Family history of Deceased : Mother, Father 2. Family history of cardiac disorder (Z82.49) : Mother 3. Family history of hypertension (Z82.49) Social History Problems   Alcohol use (Z78.9)  Former smoker (Z87.891)  Married  Occupation Review of Systems Genitourinary, constitutional, skin, eye, otolaryngeal, hematologic/lymphatic, cardiovascular,  pulmonary, endocrine, musculoskeletal, gastrointestinal, neurological and psychiatric system(s) were reviewed and pertinent findings if present are noted and are otherwise negative.  Vitals Vital Signs [Data Includes: Last 1 Day]  Recorded: 11Apr2017 04:16PM  Blood Pressure: 135 / 83 Temperature: 98.1 F Heart Rate: 84 Physical Exam Constitutional: Well nourished and well developed . No acute distress.  ENT:. The ears and nose are normal in appearance.  Neck: The appearance of the neck is normal and no neck mass is present.  Pulmonary: No respiratory distress and normal respiratory rhythm and effort.  Cardiovascular: Heart rate and rhythm are normal . No peripheral edema.  Abdomen: The abdomen is soft and nontender. No masses are palpated. No CVA tenderness. No hernias are palpable. No hepatosplenomegaly noted.  Rectal: Rectal exam demonstrates normal sphincter tone, no tenderness and no masses. Estimated prostate size is 3+. The prostate has no nodularity and is not tender. The left seminal vesicle is nonpalpable. The right seminal vesicle is nonpalpable. The perineum is normal on inspection.  Genitourinary: Examination of the penis demonstrates no discharge, no masses, no lesions and a normal meatus. The scrotum is without lesions. The right epididymis is palpably normal and non-tender. The left epididymis is palpably normal and non-tender. The right testis is non-tender and without masses. The left testis is non-tender and without masses.  Lymphatics: The femoral and inguinal nodes are not enlarged or tender.  Skin: Normal skin turgor, no visible rash and no visible skin lesions.  Neuro/Psych:. Mood and affect are appropriate.  Assessment Assessed  1. Prostate cancer Dannielle Huh) Mr Usselman has chosen to have I-125 seed Rx. We will notify Dr. Tammi Klippel, and we wil arrange surgical implant.  We have discussed the procedure itself, and anesthesia, foley catheterization and  removal in 48-72 hrs post-op. Mrs Dobberstein has many questions for Dr. Tammi Klippel, re: radiation and picking up their small dogs, exposure to herself ( she takes methotrexate) , etc. She will discuss her concerns directly with Dr. Tammi Klippel. We have discussed his post op course. ( 45 min).  Plan 1 NB: . 6 weeks for motorcycle riding or tractor riding. 2. Will arrange with Dr. Tammi Klippel for I-125 seed Rx.  Discussion/Summary cc: Tyler Pita, MD  Signatures    The information contained in this medical record document is considered private and confidential patient information. This information can only be used for the medical diagnosis and/or medical services that are being provided by the patient's selected caregivers. This information can only be distributed outside of the patient's care if the patient agrees and signs waivers of authorization for this information to be sent to an outside source or route.

## 2016-03-12 NOTE — Op Note (Signed)
Pre-operative diagnosis :   T1c Cancer of the Prostate  Postoperative diagnosis:  Same  Operation: Prostatic  I-125 seed implantation  Surgeon:  S. Gaynelle Arabian, MD  Radiation Therapist:  Dr. Tyler Pita  First assistant:  none  Anesthesia:  General LMA  Preparation: After appropriate preanesthesia, the patient was brought the abdomen, placed on the operative table in the dorsal supine position where general LMA anesthesia was introduced. He was then replaced in the dorsal lithotomy position with pubis was prepped with Betadine solution and draped in usual fashion. The history was reviewed. The armband was double checked.  Review history:  Reason For Visit F/u consult after seeing Dr. Alinda Money & Dr. Tammi Klippel  Active Problems Problems  1. Prostate cancer (C61) History of Present Illness 70 yo male returns today for a f/u consult after seeing Dr. Alinda Money & Dr. Tammi Klippel for hx of prostate cancer. He is leaning towards seed implant.  Hx of an elevated PSA of 4.22 prompting a TRUS biopsy of the prostate on 11/13/15 that confirmed the presence of Gleason 3+4=7 adenocarcinoma of the prostate with 6 out of 12 biopsy cores positive for malignancy. He has no family history of prostate cancer. TNM stage: cT1c Nx Mx PSA: 4.22 Gleason score: 3+4=7 Biopsy (11/13/15): 6/12 cores positive Left: L lateral mid (60%, 3+3=6), L mid (20%, 3+4=7, PNI) Right: R lateral apex (20%, 3+3=6), R mid (20%, 3+3=6), R lateral mid (20%, 3+3=6), R lateral base (20%, 3+3=6) Prostate volume: 23.8 cc Nomogram  OC disease: 41% EPE: 58% SVI: 5% LNI: 4% PFS (surgery): 89% at 5 years, 80% at 10 years Urinary function: He has mild lower urinary tract symptoms. IPSS is 9. Erectile function: He does have erectile dysfunction. SHIM score is 9. However, he can obtain erections with Viagra more often than not.   Statement of  Likelihood of Success: Excellent. TIME-OUT observed.:  Procedure:  The patient underwent  reevaluation of his prostate cancer by Dr. Tammi Klippel, for appropriateness of therapeutic radiation plan.  After reevaluation, we proceeded to place Foley catheter, size 16 [place], with 10 mL in the balloon. 22 separate needles were then placed in the perineum, under both fluoroscopic and ultrasound control, with 73 active radiation seeds placed. The total radiation dose is 145 Gy.  Following placement of the seeds, fluoroscopy was used to identify the seeds, with interpretation showing all seeds accounted for, with excellent coverage of the prostate.  The Foley catheter was removed, and cystourethroscopy was accomplished with both the flexed will scope and the rigid scope. No seeds were identified within the bladder or the prostatic urethra. However, a single spacer was identified within the prostatic urethra on the right side. This was removed with irrigation. Using the [employer], no radiation was detected. No seeds were identified within the bladder or the urethra. All seeds were accounted for within the prostate.  The bladder was drained of fluid, and 16 Foley catheter placed, with 10 mL within the balloon. The patient was given IV Toradol, awakened, and taken to recovery room in good condition.

## 2016-03-13 ENCOUNTER — Encounter (HOSPITAL_BASED_OUTPATIENT_CLINIC_OR_DEPARTMENT_OTHER): Payer: Self-pay | Admitting: Urology

## 2016-04-02 ENCOUNTER — Telehealth: Payer: Self-pay | Admitting: *Deleted

## 2016-04-02 NOTE — Telephone Encounter (Signed)
CALLED PATIENT TO REMIND OF APPTS. FOR 04-03-16, LVM FOR A RETURN CALL

## 2016-04-03 ENCOUNTER — Ambulatory Visit
Admit: 2016-04-03 | Discharge: 2016-04-03 | Disposition: A | Discharge status: Home / Self Care | Payer: 59 | Attending: Radiation Oncology | Admitting: Radiation Oncology

## 2016-04-03 ENCOUNTER — Encounter: Payer: Self-pay | Admitting: Radiation Oncology

## 2016-04-03 VITALS — BP 121/87 | HR 110 | Resp 16 | Wt 196.1 lb

## 2016-04-03 DIAGNOSIS — C61 Malignant neoplasm of prostate: Secondary | ICD-10-CM

## 2016-04-03 DIAGNOSIS — Z510 Encounter for antineoplastic radiation therapy: Secondary | ICD-10-CM | POA: Insufficient documentation

## 2016-04-03 DIAGNOSIS — R3911 Hesitancy of micturition: Secondary | ICD-10-CM | POA: Diagnosis not present

## 2016-04-03 MED ORDER — TAMSULOSIN HCL 0.4 MG PO CAPS: 0.4000 mg | ORAL_CAPSULE | Freq: Every day | ORAL | Status: DC

## 2016-04-03 MED ORDER — TAMSULOSIN HCL 0.4 MG PO CAPS: 0.4000 mg | ORAL_CAPSULE | Freq: Every day | ORAL | Status: AC

## 2016-04-03 NOTE — Progress Notes (Signed)
  Radiation Oncology         [phone]) [phone] ________________________________  Name: Felis Hoganson MRN: [id number]  Date: 04/03/2016  DOB: 27-Feb-1946  COMPLEX SIMULATION NOTE  NARRATIVE:  The patient was brought to the [place] today following prostate seed implantation approximately one month ago.  Identity was confirmed.  All relevant records and images related to the planned course of therapy were reviewed.  Then, the patient was set-up supine.  CT images were obtained.  The CT images were loaded into the planning software.  Then the prostate and rectum were contoured.  Treatment planning then occurred.  The implanted iodine 125 seeds were identified by the physics staff for projection of radiation distribution  I have requested : 3D Simulation  I have requested a DVH of the following structures: Prostate and rectum.    ________________________________  Sheral Apley Tammi Klippel, M.D.  This document serves as a record of services personally performed by Tyler Pita, MD. It was created on his behalf by Arlyce Harman, a trained medical scribe. The creation of this record is based on the scribe's personal observations and the provider's statements to them. This document has been checked and approved by the attending provider.

## 2016-04-03 NOTE — Progress Notes (Signed)
Radiation Oncology         [phone] ________________________________  Name: Miguel Larson MRN: [id number]  Date: 04/03/2016  DOB: [birth year]  Follow-Up Visit Note  CC: Horatio Pel, MD  Carolan Clines, MD  Diagnosis:  70 y.o. gentleman with stage T1c adenocarcinoma of the prostate with a Gleason's score of 3+4 and a PSA of 4.22     ICD-9-CM ICD-10-CM   1. Malignant neoplasm of prostate (HCC) 185 C61 tamsulosin (FLOMAX) 0.4 MG CAPS capsule    Interval Since Last Radiation:  3  weeks ; Prostate seed implant on 03/12/2016  Narrative:  The patient returns today for routine follow-up.  He is complaining of increased urinary frequency and urinary hesitation symptoms. He filled out a questionnaire regarding urinary function today providing and overall IPSS score of 25 characterizing his symptoms as severe.  His pre-implant score was 8. He denies any bowel symptoms. Reports his urinary symptoms are worse at night. Reports dysuria. Reports he stopped taking pyridium because he believed it made him weak and tired. Denies hematuria or leakage. Reports urinary frequency, intermittency, weak stream, and straining more than half the time. Reports nocturia x 4.   ALLERGIES:  has No Known Allergies.  Meds: Current Outpatient Prescriptions  Medication Sig Dispense Refill  . APAP-Caff-Dihydrocodeine 325-30-16 MG TABS Take 325 mg by mouth 4 (four) times daily as needed. 20 tablet 0  . benazepril (LOTENSIN) 20 MG tablet Take 20 mg by mouth every morning.     . hyoscyamine (LEVSIN/SL) 0.125 MG SL tablet Place 1 tablet (0.125 mg total) under the tongue every 4 (four) hours as needed. 20 tablet 1  . Multiple Vitamins-Minerals (MULTIVITAMIN GUMMIES ADULTS) CHEW Chew by mouth.    . sildenafil (VIAGRA) 100 MG tablet Take 100 mg by mouth daily as needed for erectile dysfunction.    . tamsulosin (FLOMAX) 0.4 MG CAPS capsule Take 1-2 capsules (0.4-0.8 mg total) by mouth daily. 60 capsule 1  .  trimethoprim (TRIMPEX) 100 MG tablet Take 1 tablet (100 mg total) by mouth 1 day or 1 dose. 10 tablet 1  . vitamin B-12 (CYANOCOBALAMIN) 1000 MCG tablet Take 1,000 mcg by mouth daily.    . phenazopyridine (PYRIDIUM) 200 MG tablet Take 1 tablet (200 mg total) by mouth 3 (three) times daily as needed for pain. (Patient not taking: Reported on 04/03/2016) 30 tablet 0   No current facility-administered medications for this encounter.    Physical Findings: The patient is in no acute distress. Patient is alert and oriented.  weight is 196 lb 1.6 oz (88.95 kg). His blood pressure is 121/87 and his pulse is 110. His respiration is 16 and oxygen saturation is 100%. .  No significant changes.  Lab Findings: Lab Results  Component Value Date   WBC 5.0 03/05/2016   HGB 13.5 03/05/2016   HCT 40.1 03/05/2016   MCV 94.4 03/05/2016   PLT 289 03/05/2016    Radiographic Findings:  Patient underwent CT imaging in our clinic for post implant dosimetry. The CT appears to demonstrate an adequate distribution of radioactive seeds throughout the prostate gland. There no seeds in her near the rectum. I suspect the final radiation plan and dosimetry will show appropriate coverage of the prostate gland.   Impression: The patient is recovering from the effects of radiation. His urinary symptoms should gradually improve over the next 4-6 months. We talked about this today. He is encouraged by his improvement already and is otherwise please with his outcome.   Plan:  Today, I spent time talking to the patient about his prostate seed implant and resolving urinary symptoms. We also talked about long-term follow-up for prostate cancer following seed implant. He understands that ongoing PSA determinations and digital rectal exams will help perform surveillance to rule out disease recurrence. He understands what to expect with his PSA measures. Patient was also educated today about some of the long-term effects from radiation  including a small risk for rectal bleeding and possibly erectile dysfunction. We talked about some of the general management approaches to these potential complications. However, I did encourage the patient to contact our office or return at any point if he has questions or concerns related to his previous radiation and prostate cancer.  _____________________________________  Sheral Apley. Tammi Klippel, M.D.   This document serves as a record of services personally performed by Tyler Pita, MD. It was created on his behalf by Arlyce Harman, a trained medical scribe. The creation of this record is based on the scribe's personal observations and the provider's statements to them. This document has been checked and approved by the attending provider.

## 2016-04-03 NOTE — Progress Notes (Signed)
Weight and vitals stable. Denies pain. Pre seed IPSS 8 and post seed IPSS 25. Reports his urinary symptoms are worse at night. Reports dysuria. Reports he stopped taking pyridium because he believed it make him weak and tired. Denies hematuria or leakage. Reports urinary frequency, intermittency, weak stream and straining more than half the time. Reports nocturia x 4.   BP 121/87 mmHg  Pulse 110  Resp 16  Wt 196 lb 1.6 oz (88.95 kg)  SpO2 100% Wt Readings from Last 3 Encounters:  04/03/16 196 lb 1.6 oz (88.95 kg)  03/12/16 191 lb 8 oz (86.864 kg)  11/28/15 194 lb 9.6 oz (88.27 kg)

## 2016-04-06 ENCOUNTER — Encounter: Payer: Self-pay | Admitting: Radiation Oncology

## 2016-04-06 DIAGNOSIS — Z510 Encounter for antineoplastic radiation therapy: Secondary | ICD-10-CM | POA: Diagnosis not present

## 2016-04-07 NOTE — Addendum Note (Signed)
Encounter addended by: Mariaeduarda Defranco C Mallery Harshman, RN on: 04/07/2016 11:08 AM<BR>     Documentation filed: Charges VN

## 2016-04-13 NOTE — Progress Notes (Signed)
  Radiation Oncology         [phone] ________________________________  Name: Gerrald Tori MRN: [id number]  Date: 04/06/2016  DOB: 1946-05-14  3D Planning Note   Prostate Brachytherapy Post-Implant Dosimetry  Diagnosis: 70 y.o. gentleman with stage T1c adenocarcinoma of the prostate with a Gleason's score of 3+4 and a PSA of 4.22   Narrative: On a previous date, Leeman Kerwick returned following prostate seed implantation for post implant planning. He underwent CT scan complex simulation to delineate the three-dimensional structures of the pelvis and demonstrate the radiation distribution.  Since that time, the seed localization, and complex isodose planning with dose volume histograms have now been completed.  Results:   Prostate Coverage - The dose of radiation delivered to the 90% or more of the prostate gland (D90) was 112.33% of the prescription dose. This exceeds our goal of greater than 90%. Rectal Sparing - The volume of rectal tissue receiving the prescription dose or higher was 0.0 cc. This falls under our thresholds tolerance of 1.0 cc.  Impression: The prostate seed implant appears to show adequate target coverage and appropriate rectal sparing.  Plan:  The patient will continue to follow with urology for ongoing PSA determinations. I would anticipate a high likelihood for local tumor control with minimal risk for rectal morbidity.  ________________________________  Sheral Apley Tammi Klippel, M.D.

## 2016-09-01 ENCOUNTER — Encounter: Payer: Self-pay | Admitting: Internal Medicine

## 2016-10-21 ENCOUNTER — Ambulatory Visit (INDEPENDENT_AMBULATORY_CARE_PROVIDER_SITE_OTHER): Payer: Managed Care, Other (non HMO) | Admitting: Internal Medicine

## 2016-10-21 ENCOUNTER — Encounter: Payer: Self-pay | Admitting: Internal Medicine

## 2016-10-21 ENCOUNTER — Encounter (INDEPENDENT_AMBULATORY_CARE_PROVIDER_SITE_OTHER): Payer: Self-pay

## 2016-10-21 VITALS — BP 144/84 | HR 84 | Ht 73.0 in | Wt 194.0 lb

## 2016-10-21 DIAGNOSIS — R194 Change in bowel habit: Secondary | ICD-10-CM | POA: Diagnosis not present

## 2016-10-21 DIAGNOSIS — K625 Hemorrhage of anus and rectum: Secondary | ICD-10-CM | POA: Diagnosis not present

## 2016-10-21 DIAGNOSIS — Z86010 Personal history of colonic polyps: Secondary | ICD-10-CM

## 2016-10-21 MED ORDER — NA SULFATE-K SULFATE-MG SULF 17.5-3.13-1.6 GM/177ML PO SOLN: 1.0000 | Freq: Once | ORAL | 0 refills | Status: AC

## 2016-10-21 NOTE — Progress Notes (Signed)
HISTORY OF PRESENT ILLNESS:  Miguel Larson is a 71 y.o. male , Product/process development scientist for [employer], with past medical history as listed below who is referred today by Dr. Gaynelle Arabian regarding change in bowel habits, rectal bleeding, and surveillance colonoscopy. I saw his wife Miguel Larson on one occasion last fall. In any event, the patient reports undergoing colonoscopy in [facility] with Dr. Truman Hayward approximate 5 years ago. He tells me that he had precancerous polyps and is due for follow-up. We have requested that report and pathology. Patient reports that he underwent seed implantation last year for prostate cancer (June 2017). He has been on Flonase for urinary complaints. He has noted in the month since that when he has the urge to urinate there is also the associated urge to defecate. He describes his stools as soft but not diarrhea. He did notice moderate blood on one occasion. None since. He describes bladder discomfort with urination. He was treated with ciprofloxacin. No true abdominal pain. His weight is stable. There is a reported history of colon cancer in both his father and brother. Review of outside blood work from June 2017 finds a unremarkable CBC and comprehensive metabolic panel  REVIEW OF SYSTEMS:  All non-GI ROS negative except for urinary frequency  Past Medical History:  Diagnosis Date  . Arthritis    hands  . History of hepatitis A virus infection    childhood  . Hypercholesterolemia   . Hypertension   . Nocturia   . Prostate cancer [facility]) urologist-  dr tannenbaum/  oncologist- dr Tammi Klippel   Stage T1c, Gleason 3+4,  PSA 4.22,  vol 23.82cc  . Wears glasses     Past Surgical History:  Procedure Laterality Date  . NO PAST SURGERIES    . RADIOACTIVE SEED IMPLANT N/A 03/12/2016   Procedure: RADIOACTIVE SEED IMPLANT/BRACHYTHERAPY IMPLANT;  Surgeon: Carolan Clines, MD;  Location: [place];  Service: Urology;  Laterality: N/A;    Social  History Keelon Buehl  reports that he quit smoking about 47 years ago. His smoking use included Cigarettes. He has a 5.00 pack-year smoking history. He has never used smokeless tobacco. He reports that he drinks about 8.4 oz of alcohol per week . He reports that he does not use drugs.  family history includes Cancer in his brother and brother; Colon cancer in his father; Heart disease in his mother; Hypertension in his brother, brother, brother, and sister.  No Known Allergies     PHYSICAL EXAMINATION: Vital signs: BP (!) 144/84   Pulse 84   Ht 6\' 1"  (1.854 m)   Wt 194 lb (88 kg)   BMI 25.60 kg/m   Constitutional: generally well-appearing, no acute distress Psychiatric: alert and oriented x3, cooperative Eyes: extraocular movements intact, anicteric, conjunctiva pink Mouth: oral pharynx moist, no lesions Neck: supple no lymphadenopathy Cardiovascular: heart regular rate and rhythm, no murmur Lungs: clear to auscultation bilaterally Abdomen: soft, nontender, nondistended, no obvious ascites, no peritoneal signs, normal bowel sounds, no organomegaly Rectal:Deferred until colonoscopy Extremities: no clubbing cyanosis or lower extremity edema bilaterally Skin: no lesions on visible extremities Neuro: No focal deficits. Cranial nerves intact  ASSESSMENT:  #1. Nonspecific change in bowel habits as described. A be mild rectal irritation from prior prostate seed implantation #2. Isolated episode of rectal bleeding #3. Reported history of precancerous polyp or polyps 5 years ago. Due for surveillance #4. Reported history of colon cancer in father and brother #5. History of prostate cancer status post seed  implantation June 2017  PLAN:  #1. Outside records requested #2. Schedule colonoscopy for personal history of polyps, family history of colon cancer, and evaluation of change in bowel habits and rectal bleeding.The nature of the procedure, as well as the risks, benefits, and  alternatives were carefully and thoroughly reviewed with the patient. Ample time for discussion and questions allowed. The patient understood, was satisfied, and agreed to proceed.  A copy of this note has been sent to Dr. Gaynelle Arabian

## 2016-10-21 NOTE — Patient Instructions (Signed)

## 2016-11-02 ENCOUNTER — Encounter: Payer: Self-pay | Admitting: Internal Medicine

## 2016-11-12 ENCOUNTER — Encounter: Payer: Self-pay | Admitting: Internal Medicine

## 2016-11-12 ENCOUNTER — Ambulatory Visit (AMBULATORY_SURGERY_CENTER): Payer: 59 | Admitting: Internal Medicine

## 2016-11-12 VITALS — BP 134/81 | HR 65 | Temp 98.4°F | Resp 9 | Ht 73.0 in | Wt 194.0 lb

## 2016-11-12 DIAGNOSIS — Z800 Family history of malignant neoplasm of digestive organs: Secondary | ICD-10-CM

## 2016-11-12 DIAGNOSIS — D122 Benign neoplasm of ascending colon: Secondary | ICD-10-CM

## 2016-11-12 DIAGNOSIS — D125 Benign neoplasm of sigmoid colon: Secondary | ICD-10-CM | POA: Diagnosis not present

## 2016-11-12 DIAGNOSIS — Z86010 Personal history of colonic polyps: Secondary | ICD-10-CM

## 2016-11-12 MED ORDER — SODIUM CHLORIDE 0.9 % IV SOLN: 500.0000 mL | INTRAVENOUS | Status: DC

## 2016-11-12 NOTE — Progress Notes (Signed)
Called to room to assist during endoscopic procedure.  Patient ID and intended procedure confirmed with present staff. Received instructions for my participation in the procedure from the performing physician.  

## 2016-11-12 NOTE — Op Note (Signed)
[facility] Patient Name: Miguel Larson Procedure Date: 11/12/2016 1:53 PM MRN: [id number] Endoscopist: Docia Chuck. Henrene Pastor , MD Age: 71 Referring MD:  Date of Birth: 1945/12/26 Gender: Male Account #: [id number] Procedure:                Colonoscopy, with cold snare polypectomy x 2 Indications:              Screening in patient at increased risk: Family                            history of 1st-degree relative with colorectal                            cancer(father). Also reports having had "polyps"                            elsewhere proximal 5 years ago Medicines:                Monitored Anesthesia Care Procedure:                Pre-Anesthesia Assessment:                           - Prior to the procedure, a History and Physical                            was performed, and patient medications and                            allergies were reviewed. The patient's tolerance of                            previous anesthesia was also reviewed. The risks                            and benefits of the procedure and the sedation                            options and risks were discussed with the patient.                            All questions were answered, and informed consent                            was obtained. Prior Anticoagulants: The patient has                            taken no previous anticoagulant or antiplatelet                            agents. ASA Grade Assessment: II - A patient with                            mild systemic disease. After reviewing the risks  and benefits, the patient was deemed in                            satisfactory condition to undergo the procedure.                           After obtaining informed consent, the colonoscope                            was passed under direct vision. Throughout the                            procedure, the patient's blood pressure, pulse, and   oxygen saturations were monitored continuously. The                            Model CF-HQ190L [id number]) scope was introduced                            through the anus and advanced to the the cecum,                            identified by appendiceal orifice and ileocecal                            valve. The ileocecal valve, appendiceal orifice,                            and rectum were photographed. The quality of the                            bowel preparation was excellent. The colonoscopy                            was performed without difficulty. The patient                            tolerated the procedure well. The bowel preparation                            used was SUPREP. Scope In: 2:10:45 PM Scope Out: 2:24:41 PM Scope Withdrawal Time: 0 hours 11 minutes 48 seconds  Total Procedure Duration: 0 hours 13 minutes 56 seconds  Findings:                 Two polyps were found in the sigmoid colon and                            ascending colon. The polyps were 3 mm in size.                            These polyps were removed with a cold snare.  Resection and retrieval were complete.                           The exam was otherwise without abnormality on                            direct and retroflexion views. Internal hemorrhoids                            present. Complications:            No immediate complications. Estimated blood loss:                            None. Estimated Blood Loss:     Estimated blood loss: none. Impression:               - Two 3 mm polyps in the sigmoid colon and in the                            ascending colon, removed with a cold snare.                            Resected and retrieved.                           - The examination was otherwise normal on direct                            and retroflexion views. Recommendation:           - Repeat colonoscopy in 5 years for surveillance.                           -  Patient has a contact number available for                            emergencies. The signs and symptoms of potential                            delayed complications were discussed with the                            patient. Return to normal activities tomorrow.                            Written discharge instructions were provided to the                            patient.                           - Resume previous diet.                           - Continue present medications.                           -  Await pathology results. Docia Chuck. Henrene Pastor, MD 11/12/2016 2:34:51 PM This report has been signed electronically.

## 2016-11-12 NOTE — Progress Notes (Signed)
A/ox3 pleased with MAC, report to [employer]

## 2016-11-12 NOTE — Patient Instructions (Signed)
YOU HAD AN ENDOSCOPIC PROCEDURE TODAY AT [facility]:   Refer to the procedure report that was given to you for any specific questions about what was found during the examination.  If the procedure report does not answer your questions, please call your gastroenterologist to clarify.  If you requested that your care partner not be given the details of your procedure findings, then the procedure report has been included in a sealed envelope for you to review at your convenience later.  YOU SHOULD EXPECT: Some feelings of bloating in the abdomen. Passage of more gas than usual.  Walking can help get rid of the air that was put into your GI tract during the procedure and reduce the bloating. If you had a lower endoscopy (such as a colonoscopy or flexible sigmoidoscopy) you may notice spotting of blood in your stool or on the toilet paper. If you underwent a bowel prep for your procedure, you may not have a normal bowel movement for a few days.  Please Note:  You might notice some irritation and congestion in your nose or some drainage.  This is from the oxygen used during your procedure.  There is no need for concern and it should clear up in a day or so.  SYMPTOMS TO REPORT IMMEDIATELY:   Following lower endoscopy (colonoscopy or flexible sigmoidoscopy):  Excessive amounts of blood in the stool  Significant tenderness or worsening of abdominal pains  Swelling of the abdomen that is new, acute  Fever of 100F or higher  For urgent or emergent issues, a gastroenterologist can be reached at any hour by calling [phone].  DIET:  We do recommend a small meal at first, but then you may proceed to your regular diet.  Drink plenty of fluids but you should avoid alcoholic beverages for 24 hours.  ACTIVITY:  You should plan to take it easy for the rest of today and you should NOT DRIVE or use heavy machinery until tomorrow (because of the sedation medicines used during the test).     FOLLOW UP: Our staff will call the number listed on your records the next business day following your procedure to check on you and address any questions or concerns that you may have regarding the information given to you following your procedure. If we do not reach you, we will leave a message.  However, if you are feeling well and you are not experiencing any problems, there is no need to return our call.  We will assume that you have returned to your regular daily activities without incident.  If any biopsies were taken you will be contacted by phone or by letter within the next 1-3 weeks.  Please call us at [phone] if you have not heard about the biopsies in 3 weeks.   SIGNATURES/CONFIDENTIALITY: You and/or your care partner have signed paperwork which will be entered into your electronic medical record.  These signatures attest to the fact that that the information above on your After Visit Summary has been reviewed and is understood.  Full responsibility of the confidentiality of this discharge information lies with you and/or your care-partner.  Await pathology  Please read over handout about polyps, hemorrhoids and high fiber diets  Please continue your normal medications  Next colonoscopy= 5 years

## 2016-11-13 ENCOUNTER — Telehealth: Payer: Self-pay | Admitting: *Deleted

## 2016-11-13 NOTE — Telephone Encounter (Signed)
  Follow up Call-  Call back number 11/12/2016  Post procedure Call Back phone  # [phone]  Permission to leave phone message Yes     Patient questions:  Do you have a fever, pain , or abdominal swelling? No. Pain Score  0 *  Have you tolerated food without any problems? Yes.    Have you been able to return to your normal activities? Yes.    Do you have any questions about your discharge instructions: Diet   No. Medications  No. Follow up visit  No.  Do you have questions or concerns about your Care? No.  Actions: * If pain score is 4 or above: No action needed, pain <4.

## 2016-11-23 ENCOUNTER — Encounter: Payer: Self-pay | Admitting: Internal Medicine

## 2021-01-20 DIAGNOSIS — S70361A Insect bite (nonvenomous), right thigh, initial encounter: Secondary | ICD-10-CM | POA: Diagnosis not present

## 2021-01-20 DIAGNOSIS — W57XXXA Bitten or stung by nonvenomous insect and other nonvenomous arthropods, initial encounter: Secondary | ICD-10-CM | POA: Diagnosis not present

## 2021-03-14 DIAGNOSIS — R5383 Other fatigue: Secondary | ICD-10-CM | POA: Diagnosis not present

## 2021-03-14 DIAGNOSIS — I10 Essential (primary) hypertension: Secondary | ICD-10-CM | POA: Diagnosis not present

## 2021-03-14 DIAGNOSIS — N401 Enlarged prostate with lower urinary tract symptoms: Secondary | ICD-10-CM | POA: Diagnosis not present

## 2021-03-14 DIAGNOSIS — Z125 Encounter for screening for malignant neoplasm of prostate: Secondary | ICD-10-CM | POA: Diagnosis not present

## 2021-03-18 DIAGNOSIS — J302 Other seasonal allergic rhinitis: Secondary | ICD-10-CM | POA: Diagnosis not present

## 2021-03-18 DIAGNOSIS — I10 Essential (primary) hypertension: Secondary | ICD-10-CM | POA: Diagnosis not present

## 2021-03-18 DIAGNOSIS — Z0001 Encounter for general adult medical examination with abnormal findings: Secondary | ICD-10-CM | POA: Diagnosis not present

## 2021-03-18 DIAGNOSIS — N529 Male erectile dysfunction, unspecified: Secondary | ICD-10-CM | POA: Diagnosis not present

## 2021-03-18 DIAGNOSIS — Z23 Encounter for immunization: Secondary | ICD-10-CM | POA: Diagnosis not present

## 2021-03-18 DIAGNOSIS — C61 Malignant neoplasm of prostate: Secondary | ICD-10-CM | POA: Diagnosis not present

## 2021-03-19 ENCOUNTER — Other Ambulatory Visit: Payer: Self-pay | Admitting: Internal Medicine

## 2021-03-19 DIAGNOSIS — I10 Essential (primary) hypertension: Secondary | ICD-10-CM

## 2021-03-19 DIAGNOSIS — C61 Malignant neoplasm of prostate: Secondary | ICD-10-CM | POA: Diagnosis not present

## 2021-03-21 DIAGNOSIS — R7989 Other specified abnormal findings of blood chemistry: Secondary | ICD-10-CM | POA: Diagnosis not present

## 2021-03-25 DIAGNOSIS — C61 Malignant neoplasm of prostate: Secondary | ICD-10-CM | POA: Diagnosis not present

## 2021-03-25 DIAGNOSIS — N486 Induration penis plastica: Secondary | ICD-10-CM | POA: Diagnosis not present

## 2021-04-29 ENCOUNTER — Other Ambulatory Visit: Payer: Self-pay

## 2021-04-29 ENCOUNTER — Ambulatory Visit
Admission: RE | Admit: 2021-04-29 | Discharge: 2021-04-29 | Disposition: A | Discharge status: Home / Self Care | Payer: BC Managed Care – PPO | Source: Ambulatory Visit | Attending: Internal Medicine | Admitting: Internal Medicine

## 2021-04-29 DIAGNOSIS — E7800 Pure hypercholesterolemia, unspecified: Secondary | ICD-10-CM | POA: Diagnosis not present

## 2021-04-29 DIAGNOSIS — I10 Essential (primary) hypertension: Secondary | ICD-10-CM

## 2021-05-06 DIAGNOSIS — Z23 Encounter for immunization: Secondary | ICD-10-CM | POA: Diagnosis not present

## 2021-05-06 DIAGNOSIS — I2510 Atherosclerotic heart disease of native coronary artery without angina pectoris: Secondary | ICD-10-CM | POA: Diagnosis not present

## 2021-05-31 DIAGNOSIS — R0981 Nasal congestion: Secondary | ICD-10-CM | POA: Diagnosis not present

## 2021-05-31 DIAGNOSIS — H1131 Conjunctival hemorrhage, right eye: Secondary | ICD-10-CM | POA: Diagnosis not present

## 2021-06-03 DIAGNOSIS — I2510 Atherosclerotic heart disease of native coronary artery without angina pectoris: Secondary | ICD-10-CM | POA: Diagnosis not present

## 2021-07-16 ENCOUNTER — Ambulatory Visit (INDEPENDENT_AMBULATORY_CARE_PROVIDER_SITE_OTHER): Payer: BC Managed Care – PPO | Admitting: Cardiovascular Disease

## 2021-07-16 ENCOUNTER — Encounter: Payer: Self-pay | Admitting: Cardiovascular Disease

## 2021-07-16 ENCOUNTER — Other Ambulatory Visit: Payer: Self-pay

## 2021-07-16 DIAGNOSIS — R931 Abnormal findings on diagnostic imaging of heart and coronary circulation: Secondary | ICD-10-CM

## 2021-07-16 DIAGNOSIS — I341 Nonrheumatic mitral (valve) prolapse: Secondary | ICD-10-CM | POA: Diagnosis not present

## 2021-07-16 DIAGNOSIS — E785 Hyperlipidemia, unspecified: Secondary | ICD-10-CM | POA: Insufficient documentation

## 2021-07-16 DIAGNOSIS — E782 Mixed hyperlipidemia: Secondary | ICD-10-CM

## 2021-07-16 NOTE — Assessment & Plan Note (Addendum)
History of hyperlipidemia recently started on Crestor 20 mg a day.  His most recent lipid profile performed 03/14/2021 revealed total cholesterol 240.  Apparently his cholesterol is significantly improved after starting the Crestor.  Given his elevated coronary calcium score I prefer his LDL to be less than 70.  His most recent lipid profile performed by his PCP 06/03/2021 revealed total cholesterol 144, LDL 44 and HDL of 84, acceptable/excellent results for secondary prevention.

## 2021-07-16 NOTE — Patient Instructions (Signed)
Medication Instructions:  Your physician recommends that you continue on your current medications as directed. Please refer to the Current Medication list given to you today.  *If you need a refill on your cardiac medications before your next appointment, please call your pharmacy*   Testing/Procedures: Dr. Gwenlyn Found has ordered a Myocardial Perfusion Imaging Study.   The test will take approximately 3 to 4 hours to complete; you may bring reading material.  If someone comes with you to your appointment, they will need to remain in the main lobby due to limited space in the testing area. **If you are pregnant or breastfeeding, please notify the nuclear lab prior to your appointment**  You will need to hold the following medications prior to your stress test: beta-blockers (24 hours prior to test)   How to prepare for your Myocardial Perfusion Test: Do not eat or drink 3 hours prior to your test, except you may have water. Do not consume products containing caffeine (regular or decaffeinated) 12 hours prior to your test. (ex: coffee, chocolate, sodas, tea). Do wear comfortable clothes (no dresses or overalls) and walking shoes, tennis shoes preferred (No heels or open toe shoes are allowed). Do NOT wear cologne, perfume, aftershave, or lotions (deodorant is allowed). If these instructions are not followed, your test will have to be rescheduled.  Your physician has requested that you have an echocardiogram. Echocardiography is a painless test that uses sound waves to create images of your heart. It provides your doctor with information about the size and shape of your heart and how well your heart's chambers and valves are working. This procedure takes approximately one hour. There are no restrictions for this procedure. This procedure is done at [facility].    Follow-Up: At [facility], you and your health needs are our priority.  As part of our continuing mission to provide you with  exceptional heart care, we have created designated Provider Care Teams.  These Care Teams include your primary Cardiologist (physician) and Advanced Practice Providers (APPs -  Physician Assistants and Nurse Practitioners) who all work together to provide you with the care you need, when you need it.  We recommend signing up for the patient portal called "MyChart".  Sign up information is provided on this After Visit Summary.  MyChart is used to connect with patients for Virtual Visits (Telemedicine).  Patients are able to view lab/test results, encounter notes, upcoming appointments, etc.  Non-urgent messages can be sent to your provider as well.   To learn more about what you can do with MyChart, go to [email or web address].    Your next appointment:   12 month(s)  The format for your next appointment:   In Person  Provider:   Quay Burow, MD

## 2021-07-16 NOTE — Addendum Note (Signed)
Addended by: Beatrix Fetters on: 07/16/2021 02:47 PM   Modules accepted: Orders

## 2021-07-16 NOTE — Progress Notes (Addendum)
08/20/2021 Brodyn Depuy   1946/06/06  [id number]  Primary Physician Deland Pretty, MD Primary Cardiologist: Lorretta Harp MD Lupe Carney, [place]  HPI:  Miguel Larson is a 75 y.o. thin and fit appearing married Caucasian male with no children who was referred by Dr. Shelia Media, his PCP, because of elevated coronary calcium score.  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  His only risk factors include treated hyperlipidemia.  There is no family history for heart disease.  Never had a heart attack or stroke.  He denies chest pain or shortness of breath.  He does walk several miles a day.  He had prostate cancer in the past and was treated with radioactive seed implant with low PSAs.  Coronary calcium score performed 02/27/2021 was 107 with calcium all located in the left main.   Current Meds  Medication Sig   ALPRAZolam (XANAX) 0.5 MG tablet as needed.   benazepril (LOTENSIN) 20 MG tablet Take 20 mg by mouth every morning.    Multiple Vitamins-Minerals (MULTIVITAMIN GUMMIES ADULTS) CHEW Chew by mouth daily in the afternoon.   tamsulosin (FLOMAX) 0.4 MG CAPS capsule Take 1-2 capsules (0.4-0.8 mg total) by mouth daily.     No Known Allergies  Social History   Socioeconomic History   Marital status: Married    Spouse name: Not on file   Number of children: 0   Years of education: Not on file   Highest education level: Not on file  Occupational History   Occupation: Product/process development scientist  Tobacco Use   Smoking status: Former    Packs/day: 1.00    Years: 5.00    Pack years: 5.00    Types: Cigarettes    Quit date: 03/05/1969    Years since quitting: 52.4   Smokeless tobacco: Never  Substance and Sexual Activity   Alcohol use: Yes    Alcohol/week: 14.0 standard drinks    Types: 14 Glasses of wine per week    Comment: per week   Drug use: No   Sexual activity: Yes  Other Topics Concern   Not on file   Social History Narrative   Not on file   Social Determinants of Health   Financial Resource Strain: Not on file  Food Insecurity: Not on file  Transportation Needs: Not on file  Physical Activity: Not on file  Stress: Not on file  Social Connections: Not on file  Intimate Partner Violence: Not on file     Review of Systems: General: negative for chills, fever, night sweats or weight changes.  Cardiovascular: negative for chest pain, dyspnea on exertion, edema, orthopnea, palpitations, paroxysmal nocturnal dyspnea or shortness of breath Dermatological: negative for rash Respiratory: negative for cough or wheezing Urologic: negative for hematuria Abdominal: negative for nausea, vomiting, diarrhea, bright red blood per rectum, melena, or hematemesis Neurologic: negative for visual changes, syncope, or dizziness All other systems reviewed and are otherwise negative except as noted above.    Blood pressure (!) 142/86, pulse 70, height 6\' 1"  (1.854 m), weight 187 lb 6.4 oz (85 kg), SpO2 94 %.  General appearance: alert and no distress Neck: no adenopathy, no carotid bruit, no JVD, supple, symmetrical, trachea midline, and thyroid not enlarged, symmetric, no tenderness/mass/nodules Lungs: clear to auscultation bilaterally Heart: regular rate and rhythm, S1, S2 normal, no murmur, click, rub or gallop Extremities: extremities normal, atraumatic, no cyanosis or edema Pulses: 2+ and symmetric  Skin: Skin color, texture, turgor normal. No rashes or lesions Neurologic: Grossly normal  EKG sinus rhythm at 70 without ST or T wave changes.  I personally reviewed this EKG.  ASSESSMENT AND PLAN:   Hyperlipidemia History of hyperlipidemia recently started on Crestor 20 mg a day.  His most recent lipid profile performed 03/14/2021 revealed total cholesterol 240.  Apparently his cholesterol is significantly improved after starting the Crestor.  Given his elevated coronary calcium score I prefer  his LDL to be less than 70.  His most recent lipid profile performed by his PCP 06/03/2021 revealed total cholesterol 144, LDL 44 and HDL of 84, acceptable/excellent results for secondary prevention.  Elevated coronary artery calcium score Coronary calcium score of 107 performed 04/29/2021 with all the calcium localized to the left main.  He is completely asymptomatic.  Given the location I prefer to perform a baseline functional study.  We will order an exercise Myoview stress test.  Mitral valve prolapse 2D echocardiogram performed in [place] 04/23/2009 shows slight mitral valve prolapse, normal LV function and otherwise no evidence of valvular heart disease.  I am going to repeat a 2D echocardiogram.     Lorretta Harp MD [facility] 08/20/2021 1:26 PM

## 2021-07-16 NOTE — Assessment & Plan Note (Signed)
Coronary calcium score of 107 performed 04/29/2021 with all the calcium localized to the left main.  He is completely asymptomatic.  Given the location I prefer to perform a baseline functional study.  We will order an exercise Myoview stress test.

## 2021-07-16 NOTE — Assessment & Plan Note (Signed)
2D echocardiogram performed in [place] 04/23/2009 shows slight mitral valve prolapse, normal LV function and otherwise no evidence of valvular heart disease.  I am going to repeat a 2D echocardiogram.

## 2021-07-16 NOTE — Addendum Note (Signed)
Addended by: Lorretta Harp on: 07/16/2021 03:53 PM   Modules accepted: Orders

## 2021-08-05 ENCOUNTER — Telehealth (HOSPITAL_COMMUNITY): Payer: Self-pay

## 2021-08-05 NOTE — Telephone Encounter (Signed)
Spoke with the patient, he stated that he would be here for his test. Asked to call back with any questions. S.Keir Foland EMTP

## 2021-08-07 ENCOUNTER — Ambulatory Visit (HOSPITAL_COMMUNITY): Payer: BC Managed Care – PPO | Attending: Cardiovascular Disease

## 2021-08-07 ENCOUNTER — Ambulatory Visit (HOSPITAL_BASED_OUTPATIENT_CLINIC_OR_DEPARTMENT_OTHER): Payer: BC Managed Care – PPO

## 2021-08-07 ENCOUNTER — Other Ambulatory Visit: Payer: Self-pay

## 2021-08-07 DIAGNOSIS — I341 Nonrheumatic mitral (valve) prolapse: Secondary | ICD-10-CM | POA: Diagnosis not present

## 2021-08-07 DIAGNOSIS — E782 Mixed hyperlipidemia: Secondary | ICD-10-CM | POA: Insufficient documentation

## 2021-08-07 DIAGNOSIS — R931 Abnormal findings on diagnostic imaging of heart and coronary circulation: Secondary | ICD-10-CM | POA: Diagnosis not present

## 2021-08-07 LAB — MYOCARDIAL PERFUSION IMAGING
Angina Index: 0
Base ST Depression (mm): 0 mm
Estimated workload: 13.4
Exercise duration (min): 10 min
Exercise duration (sec): 0 s
LV dias vol: 96 mL (ref 62–150)
LV sys vol: 38 mL
MPHR: 13 {beats}/min
Nuc Stress EF: 61 %
Peak HR: 176 {beats}/min
Percent HR: 121 %
Rest HR: 85 {beats}/min
Rest Nuclear Isotope Dose: 8.1 mCi
SDS: 0
SRS: 0
SSS: 0
Stress Nuclear Isotope Dose: 23.4 mCi
TID: 0.8

## 2021-08-07 LAB — ECHOCARDIOGRAM COMPLETE
Area-P 1/2: 5.75 cm2
MV M vel: 5.19 m/s
MV Peak grad: 107.7 mmHg
Radius: 0.5 cm
S' Lateral: 3.6 cm

## 2021-08-07 MED ORDER — TECHNETIUM TC 99M TETROFOSMIN IV KIT
23.4000 | PACK | Freq: Once | INTRAVENOUS | Status: AC | PRN
  Administered 2021-08-07: 23.4 via INTRAVENOUS
  Filled 2021-08-07: qty 24

## 2021-08-07 MED ORDER — TECHNETIUM TC 99M TETROFOSMIN IV KIT
8.1000 | PACK | Freq: Once | INTRAVENOUS | Status: AC | PRN
  Administered 2021-08-07: 8.1 via INTRAVENOUS
  Filled 2021-08-07: qty 9

## 2021-11-26 ENCOUNTER — Encounter: Payer: Self-pay | Admitting: Internal Medicine

## 2022-02-07 IMAGING — CT CT CARDIAC CORONARY ARTERY CALCIUM SCORE
3 series · 14 of 20 positions shown, 16 images · non-contrast
Comparison: None.

CLINICAL DATA: Hypercholesterolemia

EXAM:
CT CARDIAC CORONARY ARTERY CALCIUM SCORE
TECHNIQUE: Non-contrast imaging through the heart was performed using
prospective ECG gating. Image post processing was performed on an
independent workstation, allowing for quantitative analysis of the
heart and coronary arteries. Note that this exam targets the heart
and the chest was not imaged in its entirety.

[Series 2: calcium scoring 2.00 qr36 bestdiast 70% hrt calciu · axial · 0.35mm/px · z∈[+1790,+1874]mm · 4 of 70 slices shown]
[im 14/70  vessel]
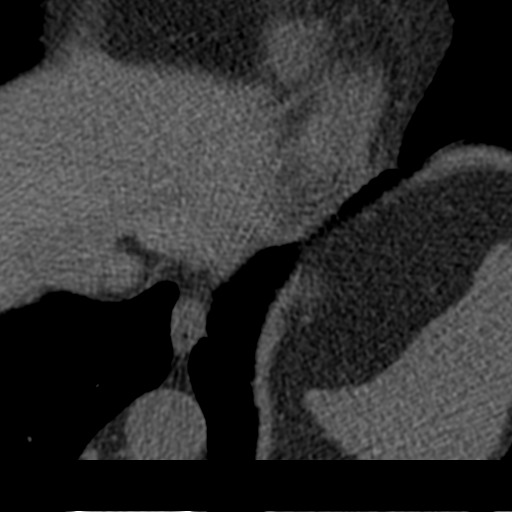
[im 28/70  vessel]
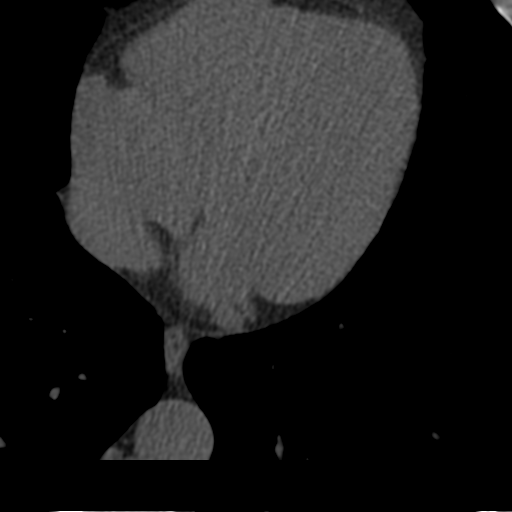
[im 42/70  vessel]
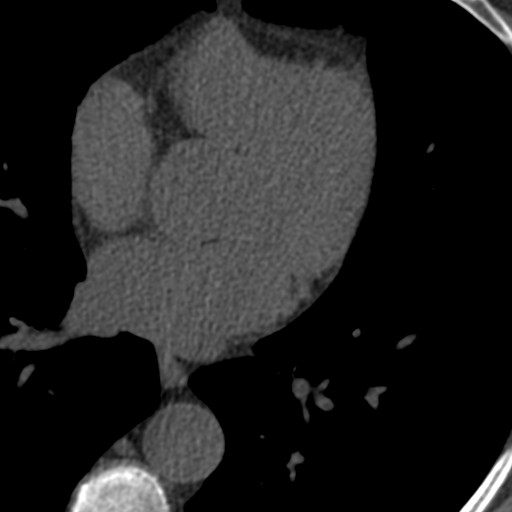
[im 56/70  vessel]
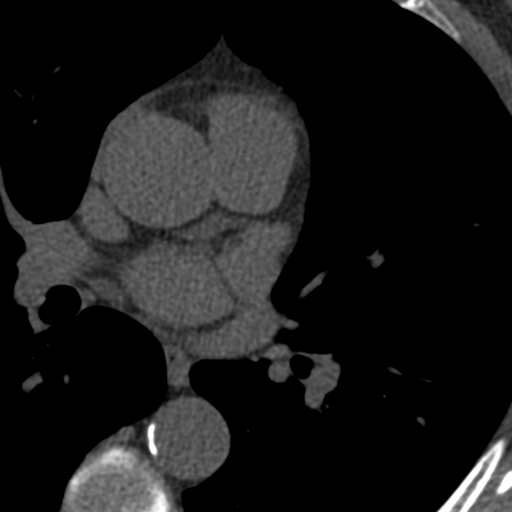

[Series 3: calcium scoring 2.00 br40 bestdiast 70% axial · axial · 0.59mm/px · z∈[+1786,+1878]mm · 5 of 70 slices shown, 7 images]
[im 12/70  vessel]
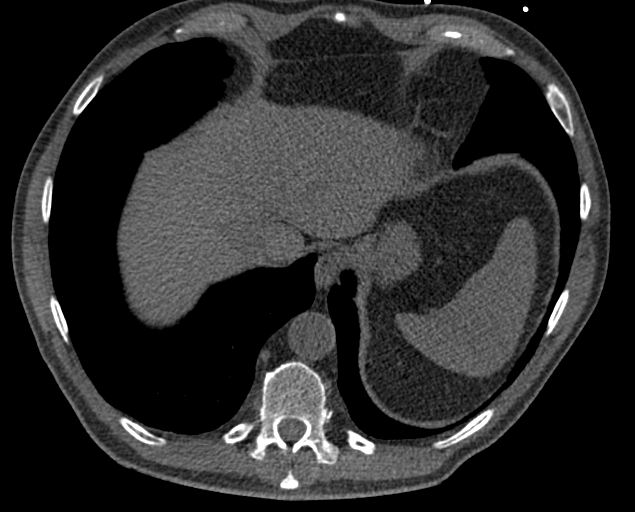
[im 12/70  lung]
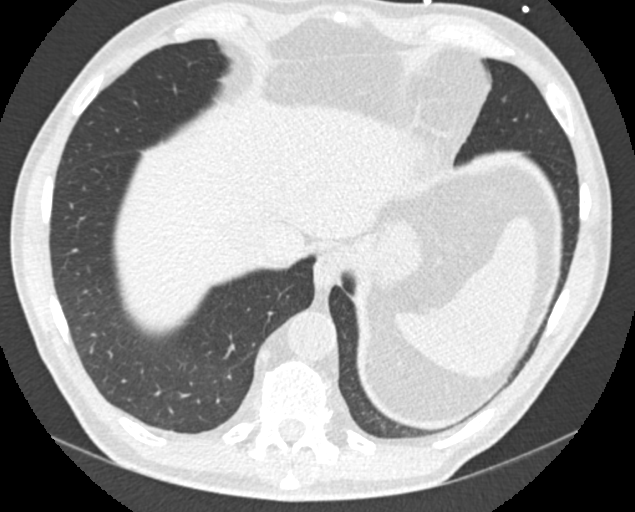
[im 24/70  vessel]
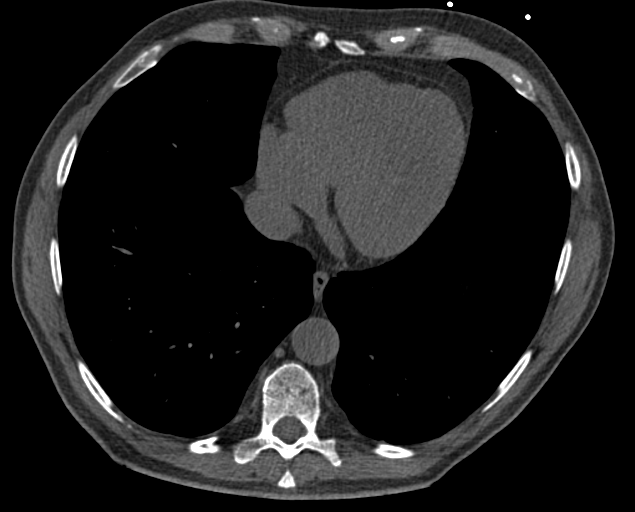
[im 35/70  vessel]
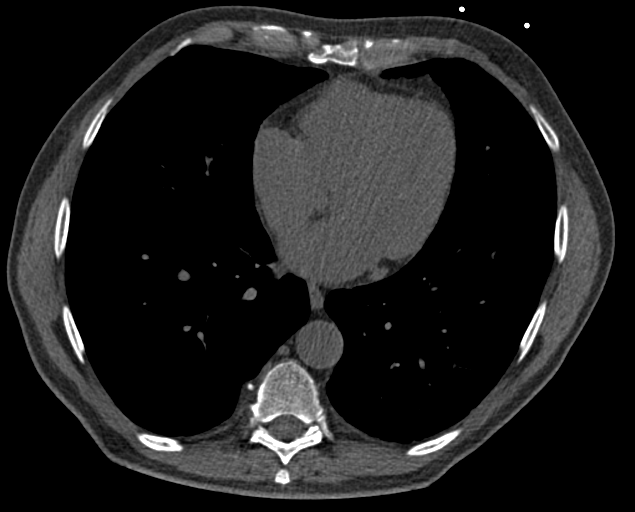
[im 47/70  vessel]
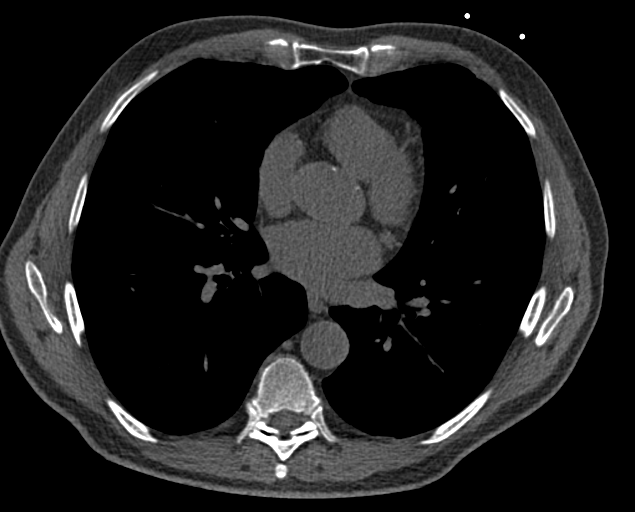
[im 58/70  vessel]
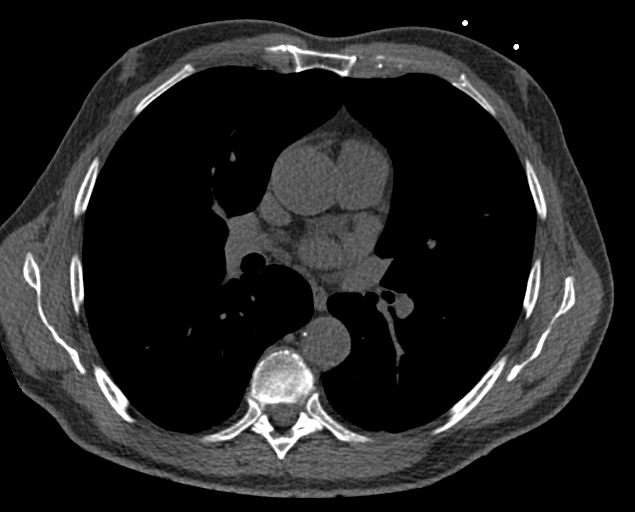
[im 58/70  lung]
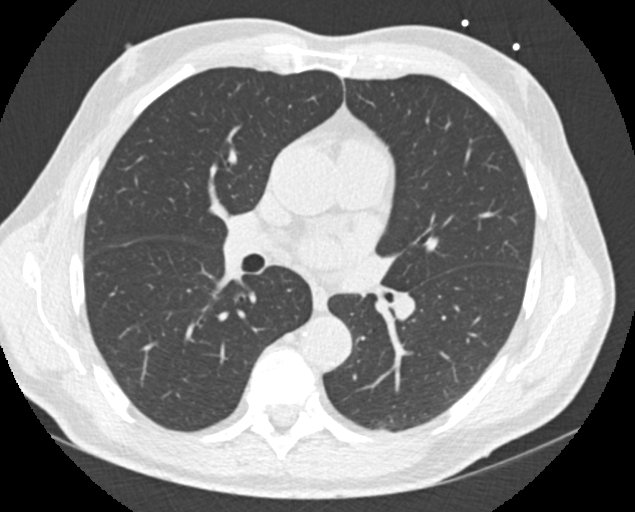

[Series 9: calcium scoring 2.00 br60 bestdiast 70% lungs · axial · 0.58mm/px · z∈[+1786,+1878]mm · 5 of 70 slices shown]
[im 12/70  vessel]
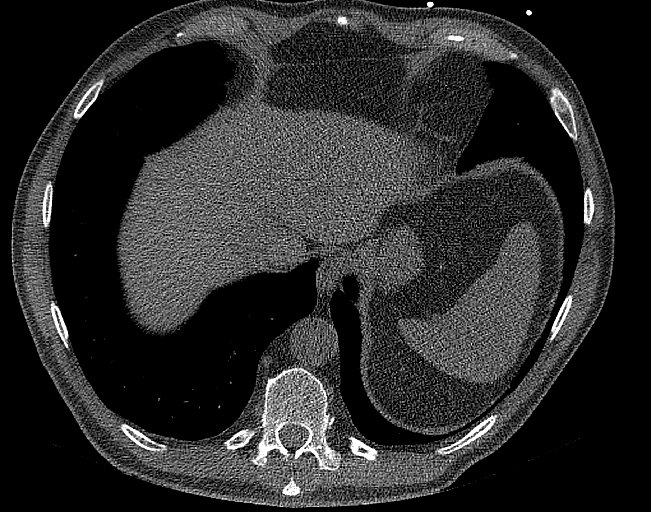
[im 24/70  vessel]
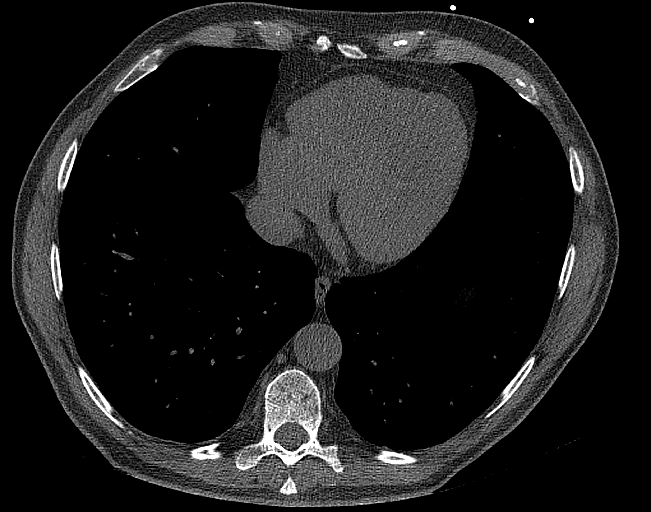
[im 35/70  vessel]
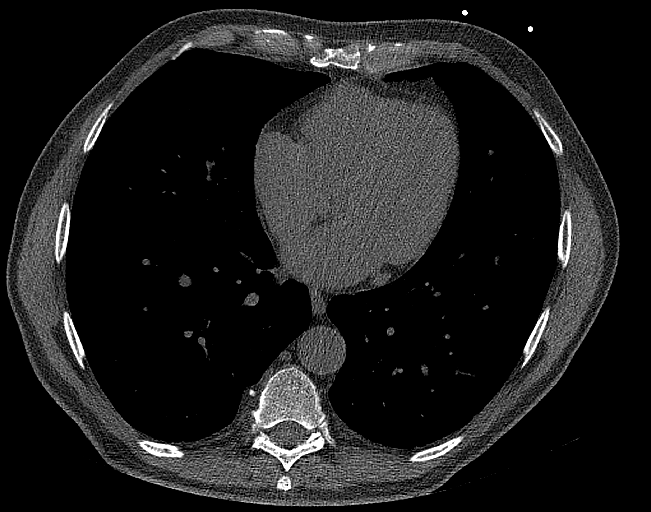
[im 47/70  vessel]
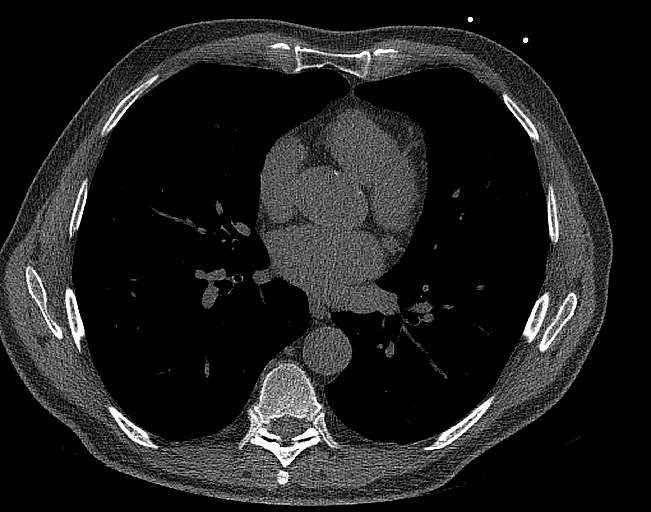
[im 58/70  vessel]
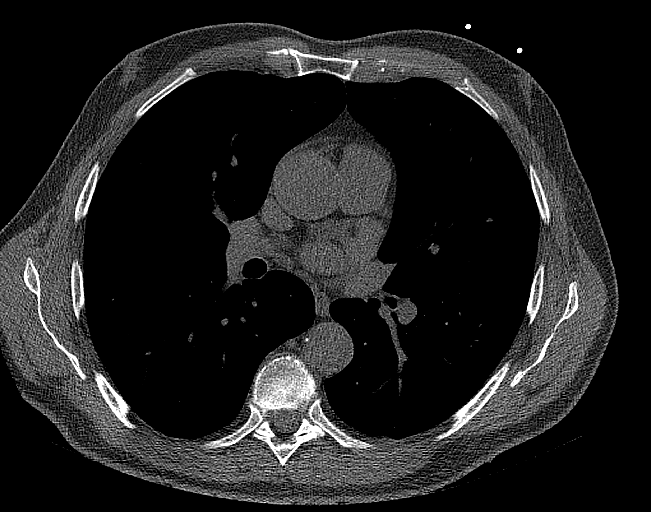

[14 of 20 positions shown; findings below may reference images not displayed]

FINDINGS: CORONARY CALCIUM SCORES:

Left Main: 107

LAD: 0

LCx: 0

RCA: 0

Total Agatston Score: 107

[HOSPITAL] percentile: 34

AORTA MEASUREMENTS:

Ascending Aorta: 37 mm

Descending Aorta: 31 mm

OTHER FINDINGS:

Heart is normal size. Scattered aortic calcifications. No aneurysm.
No adenopathy. No confluent airspace opacities or effusions. Imaging
into the upper abdomen demonstrates no acute findings. Mild
bilateral gynecomastia. No acute bony abnormality.
IMPRESSION: Total Agatston score: 107

[HOSPITAL] percentile: 34

Scattered aortic atherosclerosis.

## 2022-09-29 ENCOUNTER — Encounter: Payer: Self-pay | Admitting: Cardiovascular Disease

## 2022-09-29 ENCOUNTER — Ambulatory Visit: Payer: Self-pay | Attending: Cardiovascular Disease | Admitting: Cardiovascular Disease

## 2022-09-29 VITALS — BP 144/78 | HR 64 | Ht 73.0 in | Wt 184.4 lb

## 2022-09-29 DIAGNOSIS — I341 Nonrheumatic mitral (valve) prolapse: Secondary | ICD-10-CM

## 2022-09-29 DIAGNOSIS — E782 Mixed hyperlipidemia: Secondary | ICD-10-CM

## 2022-09-29 DIAGNOSIS — R931 Abnormal findings on diagnostic imaging of heart and coronary circulation: Secondary | ICD-10-CM

## 2022-09-29 NOTE — Patient Instructions (Signed)
Medication Instructions:  Your physician recommends that you continue on your current medications as directed. Please refer to the Current Medication list given to you today.  *If you need a refill on your cardiac medications before your next appointment, please call your pharmacy*   Testing/Procedures: Your physician has requested that you have an echocardiogram. Echocardiography is a painless test that uses sound waves to create images of your heart. It provides your doctor with information about the size and shape of your heart and how well your heart's chambers and valves are working. This procedure takes approximately one hour. There are no restrictions for this procedure. Please do NOT wear cologne, perfume, aftershave, or lotions (deodorant is allowed). Please arrive 15 minutes prior to your appointment time. To be done in November 2024. This procedure will be done at [address]    Follow-Up: At [facility], you and your health needs are our priority.  As part of our continuing mission to provide you with exceptional heart care, we have created designated Provider Care Teams.  These Care Teams include your primary Cardiologist (physician) and Advanced Practice Providers (APPs -  Physician Assistants and Nurse Practitioners) who all work together to provide you with the care you need, when you need it.  We recommend signing up for the patient portal called "MyChart".  Sign up information is provided on this After Visit Summary.  MyChart is used to connect with patients for Virtual Visits (Telemedicine).  Patients are able to view lab/test results, encounter notes, upcoming appointments, etc.  Non-urgent messages can be sent to your provider as well.   To learn more about what you can do with MyChart, go to [email or web address].    Your next appointment:   12 month(s)  The format for your next appointment:   In Person  Provider:   Quay Burow, MD

## 2022-09-29 NOTE — Assessment & Plan Note (Signed)
Elevated coronary calcium score 107 located in the left main performed 02/27/2021.  The patient works out 3 days a week and is completely asymptomatic.

## 2022-09-29 NOTE — Progress Notes (Signed)
09/29/2022 Miguel Larson   07-17-1946  [id number]  Primary Physician Deland Pretty, MD Primary Cardiologist: Lorretta Harp MD Lupe Carney, [place]  HPI:  Miguel Larson is a 77 y.o.  thin and fit appearing married Caucasian male with no children who was referred by Dr. Shelia Media, his PCP, because of elevated coronary calcium score.  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  I last saw him in the office 08/20/2021.  His only risk factors include treated hyperlipidemia. There is no family history for heart disease. Never had a heart attack or stroke. He denies chest pain or shortness of breath. He does walk several miles a day. He had prostate cancer in the past and was treated with radioactive seed implant with low PSAs. Coronary calcium score performed 02/27/2021 was 107 with calcium all located in the left main.  Since I saw him a year he has since retired.  He does walk 4 miles a day 3 days a week.  There was a questionable history of mitral valve prolapse however 2D echo performed 08/07/2021 did not show prolapse but did show mild to moderate MR.    Current Meds  Medication Sig   ALPRAZolam (XANAX) 0.5 MG tablet as needed.   benazepril (LOTENSIN) 20 MG tablet Take 20 mg by mouth every morning.    Multiple Vitamins-Minerals (MULTIVITAMIN GUMMIES ADULTS) CHEW Chew by mouth daily in the afternoon.   rosuvastatin (CRESTOR) 20 MG tablet Take 20 mg by mouth daily.   tamsulosin (FLOMAX) 0.4 MG CAPS capsule Take 1-2 capsules (0.4-0.8 mg total) by mouth daily.     No Known Allergies  Social History   Socioeconomic History   Marital status: Married    Spouse name: Not on file   Number of children: 0   Years of education: Not on file   Highest education level: Not on file  Occupational History   Occupation: Product/process development scientist  Tobacco Use   Smoking status: Former     Packs/day: 1.00    Years: 5.00    Total pack years: 5.00    Types: Cigarettes    Quit date: 03/05/1969    Years since quitting: 53.6   Smokeless tobacco: Never  Substance and Sexual Activity   Alcohol use: Yes    Alcohol/week: 14.0 standard drinks of alcohol    Types: 14 Glasses of wine per week    Comment: per week   Drug use: No   Sexual activity: Yes  Other Topics Concern   Not on file  Social History Narrative   Not on file   Social Determinants of Health   Financial Resource Strain: Not on file  Food Insecurity: Not on file  Transportation Needs: Not on file  Physical Activity: Not on file  Stress: Not on file  Social Connections: Not on file  Intimate Partner Violence: Not on file     Review of Systems: General: negative for chills, fever, night sweats or weight changes.  Cardiovascular: negative for chest pain, dyspnea on exertion, edema, orthopnea, palpitations, paroxysmal nocturnal dyspnea or shortness of breath Dermatological: negative for rash Respiratory: negative for cough or wheezing Urologic: negative for hematuria Abdominal: negative for nausea, vomiting, diarrhea, bright red blood per rectum, melena, or hematemesis Neurologic: negative for visual changes, syncope, or dizziness All other systems reviewed and are otherwise negative except as noted above.  Blood pressure (!) 144/78, pulse 64, height '6\' 1"'$  (1.854 m), weight 184 lb 6.4 oz (83.6 kg), SpO2 98 %.  General appearance: alert and no distress Neck: no adenopathy, no carotid bruit, no JVD, supple, symmetrical, trachea midline, and thyroid not enlarged, symmetric, no tenderness/mass/nodules Lungs: clear to auscultation bilaterally Heart: regular rate and rhythm, S1, S2 normal, no murmur, click, rub or gallop Extremities: extremities normal, atraumatic, no cyanosis or edema Pulses: 2+ and symmetric Skin: Skin color, texture, turgor normal. No rashes or lesions Neurologic: Grossly normal  EKG  sinus rhythm at 64 without ST or T wave changes.  Personally reviewed this EKG.  ASSESSMENT AND PLAN:   Hyperlipidemia History of hyperlipidemia on statin therapy followed by his PCP.  Lipid profile performed 03/13/2022 revealed total cholesterol 157, LDL 60 and HDL 73.  Elevated coronary artery calcium score Elevated coronary calcium score 107 located in the left main performed 02/27/2021.  The patient works out 3 days a week and is completely asymptomatic.  Mitral valve prolapse History of mitral valve prolapse in the past with echo performed 08/07/2021 that did not show mitral valve prolapse but did show mild to moderate MR.  Will repeat his echo in 12 months.     Lorretta Harp MD FACP,FACC,FAHA, [facility] 09/29/2022 10:27 AM

## 2022-09-29 NOTE — Assessment & Plan Note (Signed)
History of mitral valve prolapse in the past with echo performed 08/07/2021 that did not show mitral valve prolapse but did show mild to moderate MR.  Will repeat his echo in 12 months.

## 2022-09-29 NOTE — Assessment & Plan Note (Addendum)
History of hyperlipidemia on statin therapy followed by his PCP.  Lipid profile performed 03/13/2022 revealed total cholesterol 157, LDL 60 and HDL 73.

## 2022-11-10 ENCOUNTER — Telehealth: Payer: Self-pay | Admitting: Internal Medicine

## 2022-11-10 NOTE — Telephone Encounter (Signed)
PT is calling to schedule a recall colonoscopy. He is 76. Should I proceed with scheduling the recall or will he need to be seen in the office first. Please advise.

## 2022-11-10 NOTE — Telephone Encounter (Signed)
Dr. Henrene Pastor see note below and advise.

## 2022-11-12 NOTE — Telephone Encounter (Signed)
Direct colon in [place] "hx colon polyps and family hx CRC". See pre-visit nurse, prior. Thanks,  JP

## 2022-11-12 NOTE — Telephone Encounter (Signed)
See note from Dr. Henrene Pastor:

## 2022-11-17 ENCOUNTER — Encounter: Payer: Self-pay | Admitting: Internal Medicine

## 2022-11-30 ENCOUNTER — Ambulatory Visit (AMBULATORY_SURGERY_CENTER): Payer: Medicare Other

## 2022-11-30 VITALS — Ht 73.0 in | Wt 175.0 lb

## 2022-11-30 DIAGNOSIS — Z800 Family history of malignant neoplasm of digestive organs: Secondary | ICD-10-CM

## 2022-11-30 DIAGNOSIS — Z86010 Personal history of colonic polyps: Secondary | ICD-10-CM

## 2022-11-30 MED ORDER — NA SULFATE-K SULFATE-MG SULF 17.5-3.13-1.6 GM/177ML PO SOLN: 1.0000 | Freq: Once | ORAL | 0 refills | Status: AC

## 2022-11-30 NOTE — Progress Notes (Signed)
No egg or soy allergy known to patient  No issues known to pt with past sedation with any surgeries or procedures Patient denies ever being told they had issues or difficulty with intubation  No FH of Malignant Hyperthermia Pt is not on diet pills Pt is not on  home 02  Pt is not on blood thinners  Pt denies issues with constipation  No A fib or A flutter Cardiac test in November 2024 Pt instructed to use [email or web address] or GoodRx for a price reduction on prep

## 2022-12-30 ENCOUNTER — Encounter: Payer: Self-pay | Admitting: Internal Medicine

## 2022-12-30 ENCOUNTER — Ambulatory Visit: Payer: Medicare Other | Admitting: Internal Medicine

## 2022-12-30 VITALS — BP 134/61 | HR 50 | Temp 97.7°F | Resp 12 | Ht 73.0 in | Wt 175.0 lb

## 2022-12-30 DIAGNOSIS — Z09 Encounter for follow-up examination after completed treatment for conditions other than malignant neoplasm: Secondary | ICD-10-CM | POA: Diagnosis not present

## 2022-12-30 DIAGNOSIS — Z86010 Personal history of colonic polyps: Secondary | ICD-10-CM | POA: Diagnosis not present

## 2022-12-30 DIAGNOSIS — Z800 Family history of malignant neoplasm of digestive organs: Secondary | ICD-10-CM

## 2022-12-30 MED ORDER — SODIUM CHLORIDE 0.9 % IV SOLN
500.0000 mL | Freq: Once | INTRAVENOUS | Status: DC
Start: 2022-12-30 — End: 2022-12-30

## 2022-12-30 NOTE — Progress Notes (Signed)
Sedate, gd SR, tolerated procedure well, VSS, report to RN 

## 2022-12-30 NOTE — Progress Notes (Signed)
HISTORY OF PRESENT ILLNESS:  Miguel Larson is a 77 y.o. male with a history of adenomatous colon polyps and family history of colon cancer.  Presents today for surveillance colonoscopy.  No complaints  REVIEW OF SYSTEMS:  All non-GI ROS negative except for  Past Medical History:  Diagnosis Date   Arthritis    hands   History of hepatitis A virus infection    childhood   Hypercholesterolemia    Hypertension    Nocturia    Prostate cancer urologist-  Miguel Larson/  oncologist- Miguel Larson   Stage T1c, Gleason 3+4,  PSA 4.22,  vol 23.82cc   Wears glasses     Past Surgical History:  Procedure Laterality Date   NO PAST SURGERIES     RADIOACTIVE SEED IMPLANT N/A 03/12/2016   Procedure: RADIOACTIVE SEED IMPLANT/BRACHYTHERAPY IMPLANT;  Surgeon: Jethro Bolus, MD;  Location: [facility];  Service: Urology;  Laterality: N/A;    Social History Hymen Sandberg  reports that he quit smoking about 53 years ago. His smoking use included cigarettes. He has a 5.00 pack-year smoking history. He has never used smokeless tobacco. He reports current alcohol use of about 14.0 standard drinks of alcohol per week. He reports that he does not use drugs.  family history includes Cancer in his brother and brother; Colon cancer in his father; Heart disease in his mother; Hypertension in his brother, brother, brother, and sister.  No Known Allergies     PHYSICAL EXAMINATION: Vital signs: BP 134/73   Pulse 71   Temp 97.7 F (36.5 C)   Ht 6\' 1"  (1.854 m)   Wt 175 lb (79.4 kg)   SpO2 99%   BMI 23.09 kg/m  General: Well-developed, well-nourished, no acute distress HEENT: Sclerae are anicteric, conjunctiva pink. Oral mucosa intact Lungs: Clear Heart: Regular Abdomen: soft, nontender, nondistended, no obvious ascites, no peritoneal signs, normal bowel sounds. No organomegaly. Extremities: No edema Psychiatric: alert and oriented x3.  Cooperative     ASSESSMENT:   Personal history of multiple adenomatous polyps and family history of colon cancer  PLAN:  Surveillance colonoscopy

## 2022-12-30 NOTE — Op Note (Signed)
[facility] Patient Name: Miguel Larson Procedure Date: 12/30/2022 11:05 AM MRN: [id number] Endoscopist: Wilhemina Bonito. Marina Goodell , MD, [id number] Age: 77 Referring MD:  Date of Birth: 1946-07-12 Gender: Male Account #: [id number] Procedure:                Colonoscopy Indications:              High risk colon cancer surveillance: Personal                            history of non-advanced adenoma. Family history of                            colon cancer. Previous exam 2018 (and exams prior                            elsewhere) Medicines:                Monitored Anesthesia Care Procedure:                Pre-Anesthesia Assessment:                           - Prior to the procedure, a History and Physical                            was performed, and patient medications and                            allergies were reviewed. The patient's tolerance of                            previous anesthesia was also reviewed. The risks                            and benefits of the procedure and the sedation                            options and risks were discussed with the patient.                            All questions were answered, and informed consent                            was obtained. Prior Anticoagulants: The patient has                            taken no anticoagulant or antiplatelet agents. ASA                            Grade Assessment: II - A patient with mild systemic                            disease. After reviewing the risks and benefits,  the patient was deemed in satisfactory condition to                            undergo the procedure.                           After obtaining informed consent, the colonoscope                            was passed under direct vision. Throughout the                            procedure, the patient's blood pressure, pulse, and                            oxygen saturations were monitored continuously.  The                            CF [id number] #[id number] was introduced through the anus                            and advanced to the the cecum, identified by                            appendiceal orifice and ileocecal valve. The                            ileocecal valve, appendiceal orifice, and rectum                            were photographed. The quality of the bowel                            preparation was good. The colonoscopy was performed                            without difficulty. The patient tolerated the                            procedure well. The bowel preparation used was                            SUPREP via split dose instruction. Scope In: 11:20:09 AM Scope Out: 11:35:26 AM Scope Withdrawal Time: 0 hours 13 minutes 1 second  Total Procedure Duration: 0 hours 15 minutes 17 seconds  Findings:                 Internal hemorrhoids were found during retroflexion.                           The exam was otherwise without abnormality on                            direct and retroflexion views. Complications:  No immediate complications. Estimated blood loss:                            None. Estimated Blood Loss:     Estimated blood loss: none. Recommendation:           - Repeat colonoscopy is not recommended for                            surveillance.                           - Patient has a contact number available for                            emergencies. The signs and symptoms of potential                            delayed complications were discussed with the                            patient. Return to normal activities tomorrow.                            Written discharge instructions were provided to the                            patient.                           - Resume previous diet.                           - Continue present medications. Wilhemina BonitoJohn N. Marina GoodellPerry, MD 12/30/2022 11:40:03 AM This report has been signed electronically.

## 2022-12-30 NOTE — Progress Notes (Signed)
Pt's states no medical or surgical changes since previsit or office visit. 

## 2022-12-30 NOTE — Patient Instructions (Signed)
You may resume all of your previous medications as ordered.  YOU HAD AN ENDOSCOPIC PROCEDURE TODAY AT THE [facility]:   Refer to the procedure report that was given to you for any specific questions about what was found during the examination.  If the procedure report does not answer your questions, please call your gastroenterologist to clarify.  If you requested that your care partner not be given the details of your procedure findings, then the procedure report has been included in a sealed envelope for you to review at your convenience later.  YOU SHOULD EXPECT: Some feelings of bloating in the abdomen. Passage of more gas than usual.  Walking can help get rid of the air that was put into your GI tract during the procedure and reduce the bloating. If you had a lower endoscopy (such as a colonoscopy or flexible sigmoidoscopy) you may notice spotting of blood in your stool or on the toilet paper. If you underwent a bowel prep for your procedure, you may not have a normal bowel movement for a few days.  Please Note:  You might notice some irritation and congestion in your nose or some drainage.  This is from the oxygen used during your procedure.  There is no need for concern and it should clear up in a day or so.  SYMPTOMS TO REPORT IMMEDIATELY:  Following lower endoscopy (colonoscopy or flexible sigmoidoscopy):  Excessive amounts of blood in the stool  Significant tenderness or worsening of abdominal pains  Swelling of the abdomen that is new, acute  Fever of 100F or higher  For urgent or emergent issues, a gastroenterologist can be reached at any hour by calling [phone]. Do not use MyChart messaging for urgent concerns.    DIET:  We do recommend a small meal at first, but then you may proceed to your regular diet.  Drink plenty of fluids but you should avoid alcoholic beverages for 24 hours.  ACTIVITY:  You should plan to take it easy for the rest of today and you  should NOT DRIVE or use heavy machinery until tomorrow (because of the sedation medicines used during the test).    FOLLOW UP: Our staff will call the number listed on your records the next business day following your procedure.  We will call around 7:15- 8:00 am to check on you and address any questions or concerns that you may have regarding the information given to you following your procedure. If we do not reach you, we will leave a message.      SIGNATURES/CONFIDENTIALITY: You and/or your care partner have signed paperwork which will be entered into your electronic medical record.  These signatures attest to the fact that that the information above on your After Visit Summary has been reviewed and is understood.  Full responsibility of the confidentiality of this discharge information lies with you and/or your care-partner.

## 2022-12-31 ENCOUNTER — Telehealth: Payer: Self-pay | Admitting: *Deleted

## 2022-12-31 NOTE — Telephone Encounter (Signed)
No answer on  follow up call. Left message.   

## 2023-04-02 ENCOUNTER — Other Ambulatory Visit: Payer: Self-pay | Admitting: Internal Medicine

## 2023-04-02 DIAGNOSIS — Z87891 Personal history of nicotine dependence: Secondary | ICD-10-CM

## 2023-08-10 ENCOUNTER — Ambulatory Visit (HOSPITAL_COMMUNITY): Payer: Medicare Other | Attending: Cardiovascular Disease

## 2023-08-10 DIAGNOSIS — R931 Abnormal findings on diagnostic imaging of heart and coronary circulation: Secondary | ICD-10-CM | POA: Insufficient documentation

## 2023-08-10 DIAGNOSIS — I341 Nonrheumatic mitral (valve) prolapse: Secondary | ICD-10-CM | POA: Diagnosis present

## 2023-08-10 DIAGNOSIS — E782 Mixed hyperlipidemia: Secondary | ICD-10-CM | POA: Insufficient documentation

## 2023-08-10 LAB — ECHOCARDIOGRAM COMPLETE
Area-P 1/2: 2.65 cm2
S' Lateral: 3.3 cm

## 2023-09-28 ENCOUNTER — Ambulatory Visit: Payer: Medicare Other | Admitting: Cardiovascular Disease

## 2023-10-20 ENCOUNTER — Encounter: Payer: Self-pay | Admitting: Cardiovascular Disease

## 2023-10-20 ENCOUNTER — Ambulatory Visit: Payer: Medicare Other | Attending: Cardiovascular Disease | Admitting: Cardiovascular Disease

## 2023-10-20 VITALS — BP 134/80 | HR 67 | Wt 185.0 lb

## 2023-10-20 DIAGNOSIS — R931 Abnormal findings on diagnostic imaging of heart and coronary circulation: Secondary | ICD-10-CM | POA: Insufficient documentation

## 2023-10-20 DIAGNOSIS — E782 Mixed hyperlipidemia: Secondary | ICD-10-CM | POA: Diagnosis present

## 2023-10-20 DIAGNOSIS — I341 Nonrheumatic mitral (valve) prolapse: Secondary | ICD-10-CM | POA: Insufficient documentation

## 2023-10-20 NOTE — Assessment & Plan Note (Signed)
Elevated coronary calcium score of 107 performed 02/27/2021, all of which was located in the left main.  He is active and asymptomatic and at goal for secondary prevention.

## 2023-10-20 NOTE — Progress Notes (Signed)
10/20/2023 Miguel Larson   1946/05/05  [id number]  Primary Physician Merri Brunette, MD Primary Cardiologist: Runell Gess MD Nicholes Calamity, [place]  HPI:  Miguel Larson is a 78 y.o.  thin and fit appearing married Caucasian male with no children who was referred by Dr. Renne Crigler, his PCP, because of elevated coronary calcium score.  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  I last saw him in the office 09/29/2022.  His only risk factors include treated hyperlipidemia. There is no family history for heart disease. Never had a heart attack or stroke. He denies chest pain or shortness of breath. He does walk several miles a day. He had prostate cancer in the past and was treated with radioactive seed implant with low PSAs. Coronary calcium score performed 02/27/2021 was 107 with calcium all located in the left main.   Since I saw him a year he has has done well and is completely asymptomatic.  He does walk 4 miles a day 3 days a week.  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  There was a questionable history of mitral valve prolapse however 2D echo performed 08/10/2023 showed normal LV systolic function, grade 1 diastolic dysfunction with no evidence of mitral valve prolapse and only mild mitral regurgitation. Current Meds  Medication Sig   ALPRAZolam (XANAX) 0.5 MG tablet as needed.   benazepril (LOTENSIN) 20 MG tablet Take 20 mg by mouth every morning.    cyanocobalamin (VITAMIN B12) 500 MCG tablet Take 500 mcg by mouth daily.   Multiple Vitamins-Minerals (MULTIVITAMIN GUMMIES ADULTS) CHEW Chew by mouth daily in the afternoon.   rosuvastatin (CRESTOR) 20 MG tablet Take 20 mg by mouth daily.   tamsulosin (FLOMAX) 0.4 MG CAPS capsule Take 1-2 capsules (0.4-0.8 mg total) by mouth daily.   traZODone (DESYREL) 100 MG tablet Take 100 mg by mouth at bedtime as needed  for sleep.     No Known Allergies  Social History   Socioeconomic History   Marital status: Married    Spouse name: Not on file   Number of children: 0   Years of education: Not on file   Highest education level: Not on file  Occupational History   Occupation: Film/video editor  Tobacco Use   Smoking status: Former    Current packs/day: 0.00    Average packs/day: 1 pack/day for 5.0 years (5.0 ttl pk-yrs)    Types: Cigarettes    Start date: 03/05/1964    Quit date: 03/05/1969    Years since quitting: 54.6   Smokeless tobacco: Never  Substance and Sexual Activity   Alcohol use: Yes    Alcohol/week: 14.0 standard drinks of alcohol    Types: 14 Glasses of wine per week    Comment: per week   Drug use: No   Sexual activity: Yes  Other Topics Concern   Not on file  Social History Narrative   Not on file   Social Drivers of Health   Financial Resource Strain: Not on file  Food Insecurity: No Food Insecurity (05/31/2021)   Received [facility], [facility]   Hunger Vital Sign    Worried About Running Out of Food in the Last Year: Never true    Ran Out of Food in the Last Year: Never true  Transportation Needs: Not on file  Physical Activity: Not on file  Stress: Not on file  Social Connections: Unknown (02/02/2022)   Received from [facility], [facility]   Social Network    Social Network: Not on file  Intimate Partner Violence: Unknown (12/25/2021)   Received from [facility], [facility]   HITS    Physically Hurt: Not on file    Insult or Talk Down To: Not on file    Threaten Physical Harm: Not on file    Scream or Curse: Not on file     Review of Systems: General: negative for chills, fever, night sweats or weight changes.  Cardiovascular: negative for chest pain, dyspnea on exertion, edema, orthopnea, palpitations, paroxysmal nocturnal dyspnea or shortness of breath Dermatological: negative for rash Respiratory: negative for cough or  wheezing Urologic: negative for hematuria Abdominal: negative for nausea, vomiting, diarrhea, bright red blood per rectum, melena, or hematemesis Neurologic: negative for visual changes, syncope, or dizziness All other systems reviewed and are otherwise negative except as noted above.    Blood pressure 134/80, pulse 67, weight 185 lb (83.9 kg).  General appearance: alert and no distress Neck: no adenopathy, no carotid bruit, no JVD, supple, symmetrical, trachea midline, and thyroid not enlarged, symmetric, no tenderness/mass/nodules Lungs: clear to auscultation bilaterally Heart: regular rate and rhythm, S1, S2 normal, no murmur, click, rub or gallop Extremities: extremities normal, atraumatic, no cyanosis or edema Pulses: 2+ and symmetric Skin: Skin color, texture, turgor normal. No rashes or lesions Neurologic: Grossly normal  EKG EKG Interpretation Date/Time:  Wednesday October 20 2023 08:37:48 EST Ventricular Rate:  67 PR Interval:  178 QRS Duration:  104 QT Interval:  412 QTC Calculation: 435 R Axis:   15  Text Interpretation: Normal sinus rhythm Low voltage QRS When compared with ECG of 17-Jan-2016 08:05, No significant change was found Confirmed by Nanetta Batty [phone]) on 10/20/2023 8:56:54 AM    ASSESSMENT AND PLAN:   Hyperlipidemia History of hyperlipidemia on rosuvastatin with lipid profile performed 03/19/2023 revealing total cholesterol of 158, LDL 53 and HDL 80, at goal for secondary prevention given his elevated coronary calcium score.  Elevated coronary artery calcium score Elevated coronary calcium score of 107 performed 02/27/2021, all of which was located in the left main.  He is active and asymptomatic and at goal for secondary prevention.  Mitral valve prolapse History of mitral valve prolapse in the past with 2D echo performed 08/10/2023 showed normal LV systolic function, grade 1 diastolic dysfunction without evidence of mitral valve prolapse or other  valvular abnormalities.  There is no change since the prior study.     Runell Gess MD FACP,FACC,FAHA, [facility] 10/20/2023 8:57 AM

## 2023-10-20 NOTE — Patient Instructions (Signed)
   Follow-Up: At [facility], you and your health needs are our priority.  As part of our continuing mission to provide you with exceptional heart care, we have created designated Provider Care Teams.  These Care Teams include your primary Cardiologist (physician) and Advanced Practice Providers (APPs -  Physician Assistants and Nurse Practitioners) who all work together to provide you with the care you need, when you need it.  We recommend signing up for the patient portal called "MyChart".  Sign up information is provided on this After Visit Summary.  MyChart is used to connect with patients for Virtual Visits (Telemedicine).  Patients are able to view lab/test results, encounter notes, upcoming appointments, etc.  Non-urgent messages can be sent to your provider as well.   To learn more about what you can do with MyChart, go to [email or web address].    Your next appointment:   As needed

## 2023-10-20 NOTE — Assessment & Plan Note (Signed)
History of hyperlipidemia on rosuvastatin with lipid profile performed 03/19/2023 revealing total cholesterol of 158, LDL 53 and HDL 80, at goal for secondary prevention given his elevated coronary calcium score.

## 2023-10-20 NOTE — Assessment & Plan Note (Signed)
History of mitral valve prolapse in the past with 2D echo performed 08/10/2023 showed normal LV systolic function, grade 1 diastolic dysfunction without evidence of mitral valve prolapse or other valvular abnormalities.  There is no change since the prior study.

## 2023-11-08 ENCOUNTER — Telehealth: Payer: Self-pay | Admitting: Internal Medicine

## 2023-11-08 NOTE — Telephone Encounter (Signed)
 Patients wife called stating the patient is having issues with rectal bleeding. Seeking advise.

## 2023-11-09 NOTE — Telephone Encounter (Signed)
 Wife states pt was constipated on Fri and Sat and felt pressure in his rectum. He took a laxative and had results but had some BRBPR. He feels it is a hemorrhoid. He has been using tucs pads and prep-h with some lidocaine. He states he has had no bleeding today and knows to call back if he continues to have issues. He feels the constipation was related to dehydration and stress.

## 2023-11-11 NOTE — Telephone Encounter (Signed)
 There was an appt available for 11/17/23 at 9:30am with Doug Sou PA, please call wife and let her know about the appt.

## 2023-11-11 NOTE — Telephone Encounter (Signed)
 Returned call to PT wife and they are willing to take the appointment on 11/17/23 at 930am with Doug Sou

## 2023-11-11 NOTE — Telephone Encounter (Signed)
 Wife retuning call about Pt's symptoms. I advised the first available with a PA is 3/27 and she wants [place] to fit him in immediately. Please advise.

## 2023-11-17 ENCOUNTER — Ambulatory Visit: Payer: Medicare Other | Admitting: Gastroenterology

## 2023-11-18 ENCOUNTER — Ambulatory Visit (INDEPENDENT_AMBULATORY_CARE_PROVIDER_SITE_OTHER): Payer: Medicare Other | Admitting: Gastroenterology

## 2023-11-18 ENCOUNTER — Encounter: Payer: Self-pay | Admitting: Gastroenterology

## 2023-11-18 VITALS — BP 134/72 | HR 79 | Ht 73.0 in | Wt 181.0 lb

## 2023-11-18 DIAGNOSIS — K648 Other hemorrhoids: Secondary | ICD-10-CM | POA: Diagnosis not present

## 2023-11-18 DIAGNOSIS — K219 Gastro-esophageal reflux disease without esophagitis: Secondary | ICD-10-CM | POA: Diagnosis not present

## 2023-11-18 MED ORDER — LANSOPRAZOLE 30 MG PO CPDR: 30.0000 mg | DELAYED_RELEASE_CAPSULE | Freq: Every day | ORAL | 3 refills | Status: AC

## 2023-11-18 NOTE — Progress Notes (Signed)
 Noted.

## 2023-11-18 NOTE — Patient Instructions (Addendum)
 We have sent the following medications to your pharmacy for you to pick up at your convenience: Prevacid 30 mg daily.   Establish care in [place].  _______________________________________________________  If your blood pressure at your visit was 140/90 or greater, please contact your primary care physician to follow up on this.  _______________________________________________________  If you are age 79 or older, your body mass index should be between 23-30. Your Body mass index is 23.88 kg/m. If this is out of the aforementioned range listed, please consider follow up with your Primary Care Provider.  If you are age 43 or younger, your body mass index should be between 19-25. Your Body mass index is 23.88 kg/m. If this is out of the aformentioned range listed, please consider follow up with your Primary Care Provider.   ________________________________________________________  The [facility] providers would like to encourage you to use [facility] to communicate with providers for non-urgent requests or questions.  Due to long hold times on the telephone, sending your provider a message by [facility] may be a faster and more efficient way to get a response.  Please allow 48 business hours for a response.  Please remember that this is for non-urgent requests.  _______________________________________________________

## 2023-11-18 NOTE — Progress Notes (Signed)
 11/18/2023 Miguel Larson [id number] 1946-02-10   HISTORY OF PRESENT ILLNESS:  This is a 78 year old male who is a patient Dr. Lamar Sprinkles.  He had a colonoscopy in 12/2022 that showed only internal hemorrhoids.  He is here today for an episode that he had with some rectal bleeding.  He says that they have traveled to  as they were closing on a house there and staying in a hotel for a week, was very stressful time, etc.  He developed some constipation for a couple of days.  Took at least 4 Senokot which then resulted in bowel movements which were initially hard and then followed by some diarrhea.  Had a couple of episodes of bright red rectal bleeding for a couple of days during this time.  At this point the bleeding has resolved.  He was also having some discomfort in the perianal area, which has also resolved.  His bowel movements seem to have gone back to normal, which was previously that he was having a daily bowel movement, does not usually have an underlying issue with constipation.  He tells me that he also has some some issues with reflux and bloating.  Takes over-the-counter Prevacid and sometimes takes his wife's prescription Prevacid, which does seem to help.  He describes an issue with too much saliva in his mouth sometimes that occurs overnight or in the morning.  He has seen ENT for this issue and they gave him some recommendations on things to reduce saliva production, but he never pursued any of those.   Past Medical History:  Diagnosis Date   Arthritis    hands   History of hepatitis A virus infection    childhood   Hypercholesterolemia    Hypertension    Nocturia    Prostate cancer [facility]) urologist-  dr tannenbaum/  oncologist- dr Kathrynn Running   Stage T1c, Gleason 3+4,  PSA 4.22,  vol 23.82cc   Wears glasses    Past Surgical History:  Procedure Laterality Date   NO PAST SURGERIES     RADIOACTIVE SEED IMPLANT N/A 03/12/2016   Procedure: RADIOACTIVE SEED  IMPLANT/BRACHYTHERAPY IMPLANT;  Surgeon: Jethro Bolus, MD;  Location: [facility] [place];  Service: Urology;  Laterality: N/A;    reports that he quit smoking about 54 years ago. His smoking use included cigarettes. He started smoking about 59 years ago. He has a 5 pack-year smoking history. He has never used smokeless tobacco. He reports current alcohol use of about 14.0 standard drinks of alcohol per week. He reports that he does not use drugs. family history includes Cancer in his brother and brother; Colon cancer in his father; Heart disease in his mother; Hypertension in his brother, brother, and sister. No Known Allergies    Outpatient Encounter Medications as of 11/18/2023  Medication Sig   ALPRAZolam (XANAX) 0.5 MG tablet as needed.   benazepril (LOTENSIN) 20 MG tablet Take 20 mg by mouth every morning.    cyanocobalamin (VITAMIN B12) 500 MCG tablet Take 500 mcg by mouth daily.   lansoprazole (PREVACID) 15 MG capsule Take 45 mg by mouth daily at 12 noon. Take 2 in the morning and 1 at night.   Multiple Vitamins-Minerals (MULTIVITAMIN GUMMIES ADULTS) CHEW Chew by mouth daily in the afternoon.   rosuvastatin (CRESTOR) 20 MG tablet Take 20 mg by mouth daily.   tamsulosin (FLOMAX) 0.4 MG CAPS capsule Take 1-2 capsules (0.4-0.8 mg total) by mouth daily.   traZODone (DESYREL) 100 MG tablet  Take 100 mg by mouth at bedtime as needed for sleep.   No facility-administered encounter medications on file as of 11/18/2023.    REVIEW OF SYSTEMS  : All other systems reviewed and negative except where noted in the History of Present Illness.   PHYSICAL EXAM: BP 134/72   Pulse 79   Ht 6\' 1"  (1.854 m)   Wt 181 lb (82.1 kg)   SpO2 98%   BMI 23.88 kg/m  General: Well developed white male in no acute distress Head: Normocephalic and atraumatic Eyes:  Sclerae anicteric, conjunctiva pink. Ears: Normal auditory acuity Lungs: Clear throughout to auscultation; no W/R/R. Heart: Regular  rate and rhythm; no M/R/G. Abdomen: Soft, non-distended.  BS present.  Non-tender. Musculoskeletal: Symmetrical with no gross deformities  Skin: No lesions on visible extremities Extremities: No edema  Neurological: Alert oriented x 4, grossly non-focal Psychological:  Alert and cooperative. Normal mood and affect  ASSESSMENT AND PLAN: *Isolated episode of 2 days of constipation that led him to take several Senokot and then had hard bowel movements followed by diarrhea and had a couple episodes of red rectal bleeding and some perianal/rectal discomfort.  This resolved after couple of days and has not been recurrent.  This all occurred during the time that they were traveling, closing on a new house out of state, staying in a hotel and not having a regular diet, etc.  It was likely all situational.  Bleeding and discomfort was likely due to internal hemorrhoids seen on his colonoscopy less than a year ago.  Observe for now I am he will contact us if this becomes a recurrent problem. *GERD: Complains of some reflux and bloating.  Takes over-the-counter Prevacid and sometimes takes his wife's Prevacid which does help.  Will send a prescription for his own Prevacid 30 mg daily.  Sent to pharmacy.   CC:  Merri Brunette, MD
